# Patient Record
Sex: Female | Born: 1975 | Race: Black or African American | Hispanic: No | Marital: Single
Health system: Southern US, Community
[De-identification: ages and names within clinical notes are randomized; demographics above are authoritative.]

## PROBLEM LIST (undated history)

## (undated) DIAGNOSIS — E559 Vitamin D deficiency, unspecified: Secondary | ICD-10-CM

## (undated) DIAGNOSIS — R635 Abnormal weight gain: Secondary | ICD-10-CM

## (undated) DIAGNOSIS — F3130 Bipolar disorder, current episode depressed, mild or moderate severity, unspecified: Secondary | ICD-10-CM

## (undated) DIAGNOSIS — F5081 Binge eating disorder: Secondary | ICD-10-CM

## (undated) DIAGNOSIS — N809 Endometriosis, unspecified: Secondary | ICD-10-CM

## (undated) DIAGNOSIS — T7840XA Allergy, unspecified, initial encounter: Secondary | ICD-10-CM

## (undated) DIAGNOSIS — E538 Deficiency of other specified B group vitamins: Secondary | ICD-10-CM

## (undated) DIAGNOSIS — M7552 Bursitis of left shoulder: Secondary | ICD-10-CM

## (undated) DIAGNOSIS — K5900 Constipation, unspecified: Secondary | ICD-10-CM

## (undated) DIAGNOSIS — Z975 Presence of (intrauterine) contraceptive device: Secondary | ICD-10-CM

## (undated) DIAGNOSIS — D509 Iron deficiency anemia, unspecified: Secondary | ICD-10-CM

## (undated) DIAGNOSIS — G4700 Insomnia, unspecified: Secondary | ICD-10-CM

## (undated) DIAGNOSIS — E669 Obesity, unspecified: Secondary | ICD-10-CM

## (undated) DIAGNOSIS — F50819 Binge eating disorder, unspecified: Secondary | ICD-10-CM

## (undated) DIAGNOSIS — M1990 Unspecified osteoarthritis, unspecified site: Secondary | ICD-10-CM

## (undated) HISTORY — DX: Binge eating disorder: F50.81

## (undated) HISTORY — DX: Endometriosis, unspecified: N80.9

## (undated) HISTORY — DX: Obesity, unspecified: E66.9

## (undated) HISTORY — PX: CHOLECYSTECTOMY: SHX55

## (undated) HISTORY — DX: Binge eating disorder, unspecified: F50.819

## (undated) HISTORY — DX: Constipation, unspecified: K59.00

## (undated) HISTORY — DX: Bipolar disorder, current episode depressed, mild or moderate severity, unspecified: F31.30

## (undated) HISTORY — DX: Unspecified osteoarthritis, unspecified site: M19.90

## (undated) HISTORY — DX: Abnormal weight gain: R63.5

## (undated) HISTORY — DX: Insomnia, unspecified: G47.00

## (undated) HISTORY — DX: Deficiency of other specified B group vitamins: E53.8

## (undated) HISTORY — DX: Bursitis of left shoulder: M75.52

## (undated) HISTORY — DX: Presence of (intrauterine) contraceptive device: Z97.5

## (undated) HISTORY — DX: Vitamin D deficiency, unspecified: E55.9

## (undated) HISTORY — DX: Iron deficiency anemia, unspecified: D50.9

## (undated) HISTORY — DX: Allergy, unspecified, initial encounter: T78.40XA

## (undated) HISTORY — PX: TUBAL LIGATION: SHX77

---

## 2003-03-13 ENCOUNTER — Emergency Department (HOSPITAL_COMMUNITY): Admission: EM | Admit: 2003-03-13 | Discharge: 2003-03-14 | Payer: Self-pay | Admitting: Emergency Medicine

## 2003-06-29 ENCOUNTER — Other Ambulatory Visit: Admission: RE | Admit: 2003-06-29 | Discharge: 2003-06-29 | Payer: Self-pay | Admitting: Family Medicine

## 2003-11-11 ENCOUNTER — Emergency Department (HOSPITAL_COMMUNITY): Admission: EM | Admit: 2003-11-11 | Discharge: 2003-11-11 | Payer: Self-pay | Admitting: Family Medicine

## 2003-11-12 ENCOUNTER — Ambulatory Visit (HOSPITAL_COMMUNITY): Admission: RE | Admit: 2003-11-12 | Discharge: 2003-11-12 | Payer: Self-pay | Admitting: Family Medicine

## 2018-02-16 ENCOUNTER — Emergency Department (HOSPITAL_COMMUNITY): Payer: Medicare Other

## 2018-02-16 ENCOUNTER — Emergency Department (HOSPITAL_COMMUNITY)
Admission: EM | Admit: 2018-02-16 | Discharge: 2018-02-16 | Disposition: A | Discharge status: Home / Self Care | Payer: Medicare Other | Attending: Emergency Medicine | Admitting: Emergency Medicine

## 2018-02-16 ENCOUNTER — Other Ambulatory Visit: Payer: Self-pay

## 2018-02-16 ENCOUNTER — Encounter (HOSPITAL_COMMUNITY): Payer: Self-pay | Admitting: Emergency Medicine

## 2018-02-16 DIAGNOSIS — R202 Paresthesia of skin: Secondary | ICD-10-CM | POA: Insufficient documentation

## 2018-02-16 DIAGNOSIS — M25512 Pain in left shoulder: Secondary | ICD-10-CM | POA: Diagnosis not present

## 2018-02-16 DIAGNOSIS — R200 Anesthesia of skin: Secondary | ICD-10-CM | POA: Insufficient documentation

## 2018-02-16 MED ORDER — CYCLOBENZAPRINE HCL 10 MG PO TABS: 10.0000 mg | ORAL_TABLET | Freq: Two times a day (BID) | ORAL | 0 refills | Status: DC | PRN

## 2018-02-16 MED ORDER — KETOROLAC TROMETHAMINE 30 MG/ML IJ SOLN
30.0000 mg | Freq: Once | INTRAMUSCULAR | Status: AC
  Administered 2018-02-16: 30 mg via INTRAMUSCULAR
  Filled 2018-02-16: qty 1

## 2018-02-16 MED ORDER — PREDNISONE 10 MG (21) PO TBPK: ORAL_TABLET | Freq: Every day | ORAL | 0 refills | Status: DC

## 2018-02-16 NOTE — ED Provider Notes (Signed)
[facility]-EMERGENCY DEPT Provider Note   CSN: [id number] Arrival date & time: 02/16/18  1257     History   Chief Complaint Chief Complaint  Patient presents with  . Arm Pain    HPI Natalie Buchanan is a 42 y.o. female presents today for evaluation of acute onset, progressively worsening left shoulder pain for 3 days.  She states that pain is now constant, dull and throbbing at rest but sharp with any movement of the left shoulder.  Pain will radiate into the upper arm and she will experience numbness and tingling of the left upper extremity occasionally.  She denies chest pain or shortness of breath, no fevers or chills.  No history of IV drug use or septic joint.  She is not diabetic.  She has tried ibuprofen and Goody's arthritis with mild relief.  Denies any known injury.  Has had similar pain in the past with both shoulders intermittently but has not had it evaluated medically yet.  The history is provided by the patient.    History reviewed. No pertinent past medical history.  There are no active problems to display for this patient.   History reviewed. No pertinent surgical history.   OB History   None      Home Medications    Prior to Admission medications   Medication Sig Start Date End Date Taking? Authorizing Provider  cyclobenzaprine (FLEXERIL) 10 MG tablet Take 1 tablet (10 mg total) by mouth 2 (two) times daily as needed for muscle spasms. 02/16/18   Jasper Ruminski A, PA-C  predniSONE (STERAPRED UNI-PAK 21 TAB) 10 MG (21) TBPK tablet Take by mouth daily. Take 6 tabs by mouth daily  for 2 days, then 5 tabs for 2 days, then 4 tabs for 2 days, then 3 tabs for 2 days, 2 tabs for 2 days, then 1 tab by mouth daily for 2 days 02/16/18   Jeanie Sewer, PA-C    Family History No family history on file.  Social History Social History   Tobacco Use  . Smoking status: Not on file  Substance Use Topics  . Alcohol use: Not on file  . Drug use: Not on  file     Allergies   Patient has no known allergies.   Review of Systems Review of Systems  Constitutional: Negative for chills and fever.  Respiratory: Negative for shortness of breath.   Cardiovascular: Negative for chest pain.  Musculoskeletal: Positive for arthralgias.  Neurological: Positive for numbness.  All other systems reviewed and are negative.    Physical Exam Updated Vital Signs BP 124/88 (BP Location: Right Arm)   Pulse 79   Temp 98.4 F (36.9 C) (Oral)   Resp 18   LMP 02/09/2018 (Approximate)   SpO2 100%   Physical Exam  Constitutional: She appears well-developed and well-nourished. No distress.  Resting in chair, appears uncomfortable, holding her left upper extremity close to her side  HENT:  Head: Normocephalic and atraumatic.  Eyes: Conjunctivae are normal. Right eye exhibits no discharge. Left eye exhibits no discharge.  Neck: No JVD present. No tracheal deviation present.  Cardiovascular: Normal rate and intact distal pulses.  2+ radial pulses bilaterally  Pulmonary/Chest: Effort normal.  Abdominal: She exhibits no distension.  Musculoskeletal: She exhibits tenderness. She exhibits no edema.       Right shoulder: Normal.       Left shoulder: She exhibits decreased range of motion, tenderness and pain. She exhibits no swelling, no effusion, no  crepitus, no deformity, no laceration, normal pulse and normal strength.       Left elbow: Normal.  Tenderness to palpation of the left acromioclavicular joint.  Decreased active and passive range of motion secondary to pain.  5/5 strength of BUE major muscle groups with pain elicited with flexion of the left elbow and any range of motion of the left shoulder.  Examination of the left elbow within normal limits.  Negative empty can sign, positive Hawkins and Neer's impingement tests  Neurological: She is alert.  Fluent speech, no facial droop, sensation intact to soft touch of bilateral upper extremities.  Good  grip strength bilaterally.  Skin: Skin is warm and dry. No erythema.  Psychiatric: She has a normal mood and affect. Her behavior is normal.  Nursing note and vitals reviewed.    ED Treatments / Results  Labs (all labs ordered are listed, but only abnormal results are displayed) Labs Reviewed - No data to display  EKG None  Radiology Dg Shoulder Left  Result Date: 02/16/2018 CLINICAL DATA:  Intermittent LEFT arm pain from shoulder to elbow for 3 days, constant sharp pain since this morning EXAM: LEFT SHOULDER - 2+ VIEW COMPARISON:  None FINDINGS: Osseous mineralization normal. AC joint alignment normal. Visualized LEFT ribs intact. No acute fracture, dislocation, or bone destruction. IMPRESSION: No acute abnormalities. Electronically Signed   By: Ulyses Southward M.D.   On: 02/16/2018 14:11    Procedures Procedures (including critical care time)  Medications Ordered in ED Medications  ketorolac (TORADOL) 30 MG/ML injection 30 mg (30 mg Intramuscular Given 02/16/18 1339)     Initial Impression / Assessment and Plan / ED Course  I have reviewed the triage vital signs and the nursing notes.  Pertinent labs & imaging results that were available during my care of the patient were reviewed by me and considered in my medical decision making (see chart for details).     Patient with right shoulder/upper extremity pain for 3 days.  She is afebrile, vital signs are stable.  She is nontoxic in appearance.  She is uncomfortable with active and passive range of motion of the left upper extremity and examination is somewhat limited secondary to pain.  No warmth or erythema to the joint, no risk factors for septic joint. No chest pain and symptoms appear to be musculoskeletal in etiology, doubt ACS/MI or other acute cardiopulmonary abnormality.  Department are soft, doubt DVT.  History and physical examination suggestive of possible rotator cuff pathology.  Radiographs show no acute osseous  abnormality. RICE therapy indicated and discussed with patient.  Will discharge with shoulder sling for comfort but advised of gentle stretching throughout the day to avoid muscle stiffness.  We will also discharge with Flexeril and steroid taper.  Advised of appropriate use of these medications.  Recommend follow-up with PCP or orthopedist for reevaluation.  Discussed strict ED return precautions. Pt verbalized understanding of and agreement with plan and is safe for discharge home at this time.   Final Clinical Impressions(s) / ED Diagnoses   Final diagnoses:  Acute pain of left shoulder    ED Discharge Orders         Ordered    cyclobenzaprine (FLEXERIL) 10 MG tablet  2 times daily PRN     02/16/18 1427    predniSONE (STERAPRED UNI-PAK 21 TAB) 10 MG (21) TBPK tablet  Daily     02/16/18 1427           Rylynn Schoneman, [place] A, PA-C  02/16/18 1429    Little, Ambrose Finland, MD 02/17/18 [phone]

## 2018-02-16 NOTE — ED Triage Notes (Signed)
Patient c/o intermittent left arm pain from shoulder to elbow x3 days. Reports constant sharp pain since this morning. Denies injury.

## 2018-02-16 NOTE — Discharge Instructions (Signed)
1. Medications: Take steroid taper as prescribed with food to avoid upset stomach issues.  Do not take ibuprofen, Advil, Aleve, or Motrin while taking this medicine.  You may take [phone] mg of Tylenol every 6 hours as needed for pain. Do not exceed 4000 mg of Tylenol daily.  You can take Flexeril as needed for muscle relaxation but this medication may make you drowsy so do not drive, drink alcohol, operate heavy machinery, or make important decisions while you are using this medicine.  I typically recommend only taking this medicine at night.  You can also cut these tablets in half if they are very strong. 2. Treatment: rest, ice, elevate and use brace, drink plenty of fluids, gentle stretching (see attached exercises, but you can also YouTube search physical therapy exercises).  Make sure you take your arm out of the shoulder sling 2-3 times daily and do some gentle stretching to avoid muscle stiffness. 3. Follow Up: Please followup with orthopedics as directed or your PCP in 1 week if no improvement for discussion of your diagnoses and further evaluation after today's visit; if you do not have a primary care doctor use the resource guide provided to find one; Please return to the ER for worsening symptoms or other concerns such as worsening swelling, redness of the skin, fevers, loss of pulses, or loss of feeling

## 2018-02-17 ENCOUNTER — Encounter (HOSPITAL_COMMUNITY): Payer: Self-pay | Admitting: Emergency Medicine

## 2018-02-17 ENCOUNTER — Emergency Department (HOSPITAL_COMMUNITY)
Admission: EM | Admit: 2018-02-17 | Discharge: 2018-02-17 | Disposition: A | Discharge status: Home / Self Care | Payer: Medicare Other | Attending: Emergency Medicine | Admitting: Emergency Medicine

## 2018-02-17 ENCOUNTER — Other Ambulatory Visit: Payer: Self-pay

## 2018-02-17 DIAGNOSIS — M25512 Pain in left shoulder: Secondary | ICD-10-CM

## 2018-02-17 MED ORDER — OXYCODONE-ACETAMINOPHEN 5-325 MG PO TABS: 1.0000 | ORAL_TABLET | Freq: Four times a day (QID) | ORAL | 0 refills | Status: DC | PRN

## 2018-02-17 MED ORDER — KETOROLAC TROMETHAMINE 60 MG/2ML IM SOLN
60.0000 mg | Freq: Once | INTRAMUSCULAR | Status: AC
  Administered 2018-02-17: 60 mg via INTRAMUSCULAR
  Filled 2018-02-17: qty 2

## 2018-02-17 NOTE — ED Provider Notes (Signed)
[facility] EMERGENCY DEPARTMENT Provider Note   CSN: [id number] Arrival date & time: 02/17/18  0155     History   Chief Complaint Chief Complaint  Patient presents with  . Arm Pain    HPI Akya Fiorello is a 42 y.o. female.  Patient is a 43 year old female presenting with complaints of severe left shoulder pain.  This is been ongoing for several days and began in the absence of any injury or trauma.  She was seen at [facility] yesterday evening with the same complaint.  She was given a steroid taper along with pain medication, however this is not helping.  She denies any new injury.  She denies any numbness or tingling.  The history is provided by the patient.  Arm Pain  This is a new problem. Episode onset: 3 days ago. The problem occurs constantly. The problem has been rapidly worsening. Exacerbated by: Movement and palpation. Nothing relieves the symptoms. She has tried nothing for the symptoms.    History reviewed. No pertinent past medical history.  There are no active problems to display for this patient.   History reviewed. No pertinent surgical history.   OB History   None      Home Medications    Prior to Admission medications   Medication Sig Start Date End Date Taking? Authorizing Provider  cyclobenzaprine (FLEXERIL) 10 MG tablet Take 1 tablet (10 mg total) by mouth 2 (two) times daily as needed for muscle spasms. 02/16/18   Fawze, Mina A, PA-C  predniSONE (STERAPRED UNI-PAK 21 TAB) 10 MG (21) TBPK tablet Take by mouth daily. Take 6 tabs by mouth daily  for 2 days, then 5 tabs for 2 days, then 4 tabs for 2 days, then 3 tabs for 2 days, 2 tabs for 2 days, then 1 tab by mouth daily for 2 days 02/16/18   Jeanie Sewer, PA-C    Family History No family history on file.  Social History Social History   Tobacco Use  . Smoking status: Never Smoker  . Smokeless tobacco: Never Used  Substance Use Topics  . Alcohol use: Not Currently  . Drug  use: Not Currently     Allergies   Patient has no known allergies.   Review of Systems Review of Systems  All other systems reviewed and are negative.    Physical Exam Updated Vital Signs BP 125/75 (BP Location: Right Arm)   Pulse 82   Temp 98.4 F (36.9 C) (Oral)   Resp 18   LMP 02/09/2018 (Approximate)   SpO2 100%   Physical Exam  Constitutional: She is oriented to person, place, and time. She appears well-developed and well-nourished. No distress.  HENT:  Head: Normocephalic and atraumatic.  Neck: Normal range of motion. Neck supple.  Cardiovascular: Normal rate and regular rhythm. Exam reveals no gallop and no friction rub.  No murmur heard. Pulmonary/Chest: Effort normal and breath sounds normal. No respiratory distress. She has no wheezes.  Abdominal: Soft. Bowel sounds are normal. She exhibits no distension. There is no tenderness.  Musculoskeletal: Normal range of motion.  There is exquisite tenderness over the lateral and anterior aspect of the shoulder/deltoid.  She has pain with range of motion.  There is no warmth or erythema.  Ulnar and radial pulses are easily palpable and she is able to flex and extend all fingers.  Neurological: She is alert and oriented to person, place, and time.  Skin: Skin is warm and dry. She is not  diaphoretic.  Nursing note and vitals reviewed.    ED Treatments / Results  Labs (all labs ordered are listed, but only abnormal results are displayed) Labs Reviewed - No data to display  EKG None  Radiology Dg Shoulder Left  Result Date: 02/16/2018 CLINICAL DATA:  Intermittent LEFT arm pain from shoulder to elbow for 3 days, constant sharp pain since this morning EXAM: LEFT SHOULDER - 2+ VIEW COMPARISON:  None FINDINGS: Osseous mineralization normal. AC joint alignment normal. Visualized LEFT ribs intact. No acute fracture, dislocation, or bone destruction. IMPRESSION: No acute abnormalities. Electronically Signed   By: Ulyses Southward  M.D.   On: 02/16/2018 14:11    Procedures Procedures (including critical care time)  Medications Ordered in ED Medications  ketorolac (TORADOL) injection 60 mg (has no administration in time range)     Initial Impression / Assessment and Plan / ED Course  I have reviewed the triage vital signs and the nursing notes.  Pertinent labs & imaging results that were available during my care of the patient were reviewed by me and considered in my medical decision making (see chart for details).  Patient with left shoulder pain that is atraumatic.  Her arm is neurovascularly intact.  I doubt a septic joint.  She will be treated with Toradol and discharged with Percocet.  She was prescribed a steroid taper yesterday at [facility] which she has not began taking.  I have advised her to start taking this as soon as possible.  Final Clinical Impressions(s) / ED Diagnoses   Final diagnoses:  None    ED Discharge Orders    None       Geoffery Lyons, MD 02/17/18 [phone]

## 2018-02-17 NOTE — ED Triage Notes (Signed)
Pt reports L arm pain X 4 days. Pt states pain is so severe she is unable to move her arm. Pt seen at [facility] yesterday for same, states pain medicine isnt helping.

## 2018-02-17 NOTE — Discharge Instructions (Addendum)
Begin taking the steroid taper that was prescribed yesterday evening.  Percocet as prescribed as needed for pain.  No driving or operating heavy machinery while taking this medication.  Follow-up with your primary doctor if symptoms are not improving in the next 3 to 4 days.

## 2018-08-07 ENCOUNTER — Telehealth: Payer: Self-pay | Admitting: Internal Medicine

## 2018-08-07 NOTE — Telephone Encounter (Signed)
A new hem appt has been scheduled for the pt to see Dr. Melton Alar on 4/1 at 950am. Pt aware to arrive 15 minutes early.

## 2018-08-09 ENCOUNTER — Encounter (HOSPITAL_COMMUNITY): Payer: Self-pay | Admitting: Emergency Medicine

## 2018-08-09 ENCOUNTER — Emergency Department (HOSPITAL_COMMUNITY)
Admission: EM | Admit: 2018-08-09 | Discharge: 2018-08-09 | Disposition: A | Discharge status: Home / Self Care | Payer: Medicare Other | Attending: Emergency Medicine | Admitting: Emergency Medicine

## 2018-08-09 ENCOUNTER — Other Ambulatory Visit: Payer: Self-pay

## 2018-08-09 DIAGNOSIS — M79601 Pain in right arm: Secondary | ICD-10-CM | POA: Insufficient documentation

## 2018-08-09 DIAGNOSIS — M79603 Pain in arm, unspecified: Secondary | ICD-10-CM

## 2018-08-09 DIAGNOSIS — Z5321 Procedure and treatment not carried out due to patient leaving prior to being seen by health care provider: Secondary | ICD-10-CM | POA: Diagnosis not present

## 2018-08-09 NOTE — ED Provider Notes (Signed)
  3:07 AM Notified that patient was seen leaving the department before I could get to her room.  Apparently her 42 year old daughter was left in the lobby unattended and had to leave as no one else available to care for her child.  I did not see or evaluate patient.     Garlon Hatchet, PA-C 08/09/18 2080    Derwood Kaplan, MD 08/13/18 [phone]

## 2018-08-09 NOTE — ED Notes (Signed)
Pt. Left without being seen by Provider. Refused to sign signature pad.

## 2018-08-09 NOTE — ED Triage Notes (Signed)
Patient is complaining of right arm pain started last week. Patient states she is not able to move her arm. Patient states in November she had the same thing in the left arm.

## 2018-08-09 NOTE — ED Triage Notes (Deleted)
Patient complaining of abscess on right lower inner arm. Patient is an IV drug user. Patient states it started 4 days ago. Arm is redden. Patient has skin marker on skin and is not going outside of the boundaries. 

## 2018-08-09 NOTE — ED Notes (Signed)
Pt. Seen leaving the department @03 :05am.

## 2018-08-13 ENCOUNTER — Inpatient Hospital Stay: Payer: Medicare Other | Attending: Internal Medicine | Admitting: Internal Medicine

## 2018-08-13 ENCOUNTER — Encounter: Payer: Self-pay | Admitting: Internal Medicine

## 2018-08-13 ENCOUNTER — Other Ambulatory Visit: Payer: Self-pay

## 2018-08-13 VITALS — BP 124/69 | HR 83 | Temp 99.0°F | Resp 18 | Ht 62.0 in | Wt 273.5 lb

## 2018-08-13 DIAGNOSIS — D508 Other iron deficiency anemias: Secondary | ICD-10-CM

## 2018-08-13 DIAGNOSIS — Z92241 Personal history of systemic steroid therapy: Secondary | ICD-10-CM

## 2018-08-13 DIAGNOSIS — Z8049 Family history of malignant neoplasm of other genital organs: Secondary | ICD-10-CM

## 2018-08-13 DIAGNOSIS — D72829 Elevated white blood cell count, unspecified: Secondary | ICD-10-CM

## 2018-08-13 DIAGNOSIS — M2550 Pain in unspecified joint: Secondary | ICD-10-CM

## 2018-08-13 DIAGNOSIS — F329 Major depressive disorder, single episode, unspecified: Secondary | ICD-10-CM

## 2018-08-13 DIAGNOSIS — D72828 Other elevated white blood cell count: Secondary | ICD-10-CM

## 2018-08-13 DIAGNOSIS — Z114 Encounter for screening for human immunodeficiency virus [HIV]: Secondary | ICD-10-CM

## 2018-08-13 DIAGNOSIS — R718 Other abnormality of red blood cells: Secondary | ICD-10-CM | POA: Diagnosis not present

## 2018-08-13 DIAGNOSIS — M25559 Pain in unspecified hip: Secondary | ICD-10-CM

## 2018-08-13 DIAGNOSIS — F319 Bipolar disorder, unspecified: Secondary | ICD-10-CM

## 2018-08-13 DIAGNOSIS — N809 Endometriosis, unspecified: Secondary | ICD-10-CM | POA: Insufficient documentation

## 2018-08-13 DIAGNOSIS — Z975 Presence of (intrauterine) contraceptive device: Secondary | ICD-10-CM | POA: Insufficient documentation

## 2018-08-13 DIAGNOSIS — D539 Nutritional anemia, unspecified: Secondary | ICD-10-CM

## 2018-08-13 DIAGNOSIS — F419 Anxiety disorder, unspecified: Secondary | ICD-10-CM

## 2018-08-13 DIAGNOSIS — Z803 Family history of malignant neoplasm of breast: Secondary | ICD-10-CM

## 2018-08-13 NOTE — Progress Notes (Signed)
Referring Physician:  Dr. Pete Pelt, [facility].    Diagnosis Other iron deficiency anemia - Plan: CBC with Differential (Cancer Center Only), CMP (Cancer Center only), Lactate dehydrogenase (LDH), Sedimentation rate, Ferritin, Iron and TIBC, Jak 2 V617F ([facility]), Jak 2 Exon 12 ([facility]), BCR ABL1 FISH ([facility]), Hemoglobinopathy evaluation, Vitamin B12, Folate, Serum, Methylmalonic acid, serum, Hepatitis B surface antibody, Hepatitis B surface antigen, Hepatitis B core antibody, total, Hepatitis C antibody, HIV antibody (with reflex), Rheumatoid factor, ANA, IFA (with reflex), SPEP with reflex to IFE  Pain in joint involving pelvic region and thigh, unspecified laterality - Plan: CBC with Differential (Cancer Center Only), CMP (Cancer Center only), Lactate dehydrogenase (LDH), Sedimentation rate, Ferritin, Iron and TIBC, Jak 2 V617F ([facility]), Jak 2 Exon 12 ([facility]), BCR ABL1 FISH ([facility]), Hemoglobinopathy evaluation, Vitamin B12, Folate, Serum, Methylmalonic acid, serum, Hepatitis B surface antibody, Hepatitis B surface antigen, Hepatitis B core antibody, total, Hepatitis C antibody, HIV antibody (with reflex), Rheumatoid factor, ANA, IFA (with reflex), SPEP with reflex to IFE  Other elevated white blood cell (WBC) count - Plan: CBC with Differential (Cancer Center Only), CMP (Cancer Center only), Lactate dehydrogenase (LDH), Sedimentation rate, Ferritin, Iron and TIBC, Jak 2 V617F ([facility]), Jak 2 Exon 12 ([facility]), BCR ABL1 FISH ([facility]), Hemoglobinopathy evaluation, Vitamin B12, Folate, Serum, Methylmalonic acid, serum, Hepatitis B surface antibody, Hepatitis B surface antigen, Hepatitis B core antibody, total, Hepatitis C antibody, HIV antibody (with reflex), Rheumatoid factor, ANA, IFA (with reflex), SPEP with reflex to IFE  Screening for HIV (human immunodeficiency virus) - Plan: CBC with Differential (Cancer Center Only), CMP (Cancer Center only), Lactate  dehydrogenase (LDH), Sedimentation rate, Ferritin, Iron and TIBC, Jak 2 V617F ([facility]), Jak 2 Exon 12 ([facility]), BCR ABL1 FISH ([facility]), Hemoglobinopathy evaluation, Vitamin B12, Folate, Serum, Methylmalonic acid, serum, Hepatitis B surface antibody, Hepatitis B surface antigen, Hepatitis B core antibody, total, Hepatitis C antibody, HIV antibody (with reflex), Rheumatoid factor, ANA, IFA (with reflex), SPEP with reflex to IFE  Nutritional anemia - Plan: CBC with Differential (Cancer Center Only), CMP (Cancer Center only), Lactate dehydrogenase (LDH), Sedimentation rate, Ferritin, Iron and TIBC, Jak 2 V617F ([facility]), Jak 2 Exon 12 ([facility]), BCR ABL1 FISH ([facility]), Hemoglobinopathy evaluation, Vitamin B12, Folate, Serum, Methylmalonic acid, serum, Hepatitis B surface antibody, Hepatitis B surface antigen, Hepatitis B core antibody, total, Hepatitis C antibody, HIV antibody (with reflex), Rheumatoid factor, ANA, IFA (with reflex), SPEP with reflex to IFE  Staging Cancer Staging No matching staging information was found for the patient.  Assessment and Plan:  1.  Leucocytosis.  43 year old female referred for evaluation due to leukocytosis.  Pt reports joint discomfort and is occasionally treated with pulse steroids. She reports she last took Prednisone in February.  She denies fevers, chills, night sweats and has noted no adenopathy.  She denies smoking.  Labs done 09/14/2016 showed WBC 8 HB 12.7 plts 307,000.  Normal differential.  1Labs done 07/24/2017 showed WBC 10 HB 12.4.  Labs done 05/15/2018 showed WBC 15 HB 11.2 pts 365,000.  Labs done 07/02/2018 showed WBC 11.6 HB 11.6 plts 398,000.  Pt denies new medications or smoking.  She denies family history of leukemias or lymphomas.  Pt is seen today for consultation due to leucocytosis.    Discussed with pt today potential etiologies of mild elevation in WBC count.  She does pulse steroids which are likely to cause leucocytosis.  Pt will have labs done and  will check Hepatitis panel and HIV, BCR/ABL and Jak 2  as well as sed rate, RF, ANA.  Pt will be set up for phone visit to go over results.  All questions answered and pt expressed understanding of information presented.    2.  Joint pain.  Pt reports she takes pulse steroids.  Awaiting results of ANA, RF, sed rate and SPEP.   She will be set up for phone visit to go over results.   3.  Microcytosis.  Labs done 07/02/2018 showed HB 11.6 with MCV of 81.  Awaiting results of iron studies and HB electrophoresis.  Pt will have phone visit follow-up to go over results.   4.  Bipolar disorder, anxiety and depression.  Follow-up with PCP or psychiatry as directed.   5.  Health maintenance. Mammogram screenings as recommended.    40 minutes spent with more than 50% spent in review of records, counseling and coordination of care.    HPI:  43 year old female referred for evaluation due to leukocytosis.  Pt reports joint discomfort and is occasionally treated with pulse steroids. She reports she last took Prednisone in February.  She denies fevers, chills, night sweats and has noted no adenopathy.  She denies smoking.  Labs done 09/14/2016 showed WBC 8 HB 12.7 plts 307,000.  Normal differential.  1Labs done 07/24/2017 showed WBC 10 HB 12.4.  Labs done 05/15/2018 showed WBC 15 HB 11.2 pts 365,000.  Labs done 07/02/2018 showed WBC 11.6 HB 11.6 plts 398,000.  Pt denies new medications or smoking.  She denies family history of leukemias or lymphomas.  Pt is seen today for consultation due to leucocytosis.    Problem List Anxiety, depression, bipolar disorder,   Past Medical History Anxiety, depression, bipolar disorder, arthritis  Past Surgical History Cholecystectomy and tubal ligation  Family History Breast cancer in mother and sister.  Cervical cancer in sister.  No history of leukemia or lymphoma in family.    Social History  reports that she is a non-smoker but has been exposed to tobacco smoke. She has  never used smokeless tobacco. She reports that she does not drink alcohol or use drugs.  Medications  Current Outpatient Medications:  .  diclofenac (VOLTAREN) 75 MG EC tablet, Take 75 mg by mouth 2 (two) times daily., Disp: , Rfl:  .  meloxicam (MOBIC) 15 MG tablet, Take 15 mg by mouth daily., Disp: , Rfl:  .  polyethylene glycol (MIRALAX / GLYCOLAX) packet, Take 17 g by mouth daily., Disp: , Rfl:  .  Brexpiprazole (REXULTI) 0.5 MG TABS, TK 1 T PO HS UTD  psych prescribes, Disp: , Rfl:  .  buPROPion (WELLBUTRIN XL) 150 MG 24 hr tablet, Take by mouth., Disp: , Rfl:  .  buPROPion (WELLBUTRIN XL) 300 MG 24 hr tablet, Take by mouth., Disp: , Rfl:  .  cyclobenzaprine (FLEXERIL) 10 MG tablet, Take 1 tablet (10 mg total) by mouth 2 (two) times daily as needed for muscle spasms., Disp: 10 tablet, Rfl: 0 .  doxepin (SINEQUAN) 10 MG capsule, TK 1 C PO HS FOR SLP OR INSOMNIA, Disp: , Rfl:  .  gabapentin (NEURONTIN) 400 MG capsule, TAKE 1 CAPSULE BY MOUTH NIGHTLY AS NEEDED FOR PAINFUL MENSTRUATION, Disp: , Rfl:  .  oxyCODONE-acetaminophen (PERCOCET) 5-325 MG tablet, Take 1-2 tablets by mouth every 6 (six) hours as needed. (Patient not taking: Reported on 08/13/2018), Disp: 15 tablet, Rfl: 0 .  predniSONE (STERAPRED UNI-PAK 21 TAB) 10 MG (21) TBPK tablet, Take by mouth daily. Take 6 tabs by mouth daily  for 2 days, then 5 tabs for 2 days, then 4 tabs for 2 days, then 3 tabs for 2 days, 2 tabs for 2 days, then 1 tab by mouth daily for 2 days, Disp: 42 tablet, Rfl: 0  Allergies Duloxetine hcl and Linaclotide  Review of Systems Review of Systems - Oncology ROS negative other than joint pain.     Physical Exam  Vitals Wt Readings from Last 3 Encounters:  08/13/18 273 lb 8 oz (124.1 kg)  08/09/18 160 lb (72.6 kg)   Temp Readings from Last 3 Encounters:  08/13/18 99 F (37.2 C) (Oral)  08/09/18 98.8 F (37.1 C) (Oral)  02/17/18 98.4 F (36.9 C) (Oral)   BP Readings from Last 3 Encounters:   08/13/18 124/69  08/09/18 127/74  02/17/18 125/75   Pulse Readings from Last 3 Encounters:  08/13/18 83  08/09/18 79  02/17/18 82   Constitutional: Well-developed, well-nourished, and in no distress.   HENT: Head: Normocephalic and atraumatic.  Mouth/Throat: No oropharyngeal exudate. Mucosa moist. Eyes: Pupils are equal, round, and reactive to light. Conjunctivae are normal. No scleral icterus.  Neck: Normal range of motion. Neck supple. No JVD present.  Cardiovascular: Normal rate, regular rhythm and normal heart sounds.  Exam reveals no gallop and no friction rub.   No murmur heard. Pulmonary/Chest: Effort normal and breath sounds normal. No respiratory distress. No wheezes.No rales.  Abdominal: Soft. Bowel sounds are normal. No distension. There is no tenderness. There is no guarding.  Musculoskeletal: No edema or tenderness.  Lymphadenopathy: No cervical,axillary or supraclavicular adenopathy.  Neurological: Alert and oriented to person, place, and time. No cranial nerve deficit.  Skin: Skin is warm and dry. No rash noted. No erythema. No pallor.  Psychiatric: Affect and judgment normal.   Labs No visits with results within 3 Day(s) from this visit.  Latest known visit with results is:  No results found for any previous visit.     Pathology Orders Placed This Encounter  Procedures  . CBC with Differential (Cancer Center Only)    Standing Status:   Future    Standing Expiration Date:   08/13/2019  . CMP (Cancer Center only)    Standing Status:   Future    Standing Expiration Date:   08/13/2019  . Lactate dehydrogenase (LDH)    Standing Status:   Future    Standing Expiration Date:   08/13/2019  . Sedimentation rate    Standing Status:   Future    Standing Expiration Date:   08/13/2019  . Ferritin    Standing Status:   Future    Standing Expiration Date:   08/13/2019  . Iron and TIBC    Standing Status:   Future    Standing Expiration Date:   08/13/2019  . Jak 2 V617F  ([facility])    Standing Status:   Future    Standing Expiration Date:   08/13/2019  . Jak 2 Exon 12 ([facility])    Standing Status:   Future    Standing Expiration Date:   08/13/2019  . BCR ABL1 FISH ([facility])    Standing Status:   Future    Standing Expiration Date:   08/13/2019  . Hemoglobinopathy evaluation    Standing Status:   Future    Standing Expiration Date:   08/13/2019  . Vitamin B12    Standing Status:   Future    Standing Expiration Date:   08/13/2019  . Folate, Serum    Standing Status:   Future  Standing Expiration Date:   08/13/2019  . Methylmalonic acid, serum    Standing Status:   Future    Standing Expiration Date:   08/13/2019  . Hepatitis B surface antibody    Standing Status:   Future    Standing Expiration Date:   08/13/2019  . Hepatitis B surface antigen    Standing Status:   Future    Standing Expiration Date:   08/13/2019  . Hepatitis B core antibody, total    Standing Status:   Future    Standing Expiration Date:   08/13/2019  . Hepatitis C antibody    Standing Status:   Future    Standing Expiration Date:   08/13/2019  . HIV antibody (with reflex)    Standing Status:   Future    Standing Expiration Date:   08/13/2019  . Rheumatoid factor    Standing Status:   Future    Standing Expiration Date:   08/13/2019  . ANA, IFA (with reflex)    Standing Status:   Future    Standing Expiration Date:   08/13/2019  . SPEP with reflex to IFE    Standing Status:   Future    Standing Expiration Date:   08/13/2019       Ahmed Prima MD

## 2018-08-14 ENCOUNTER — Other Ambulatory Visit: Payer: Medicare Other

## 2018-08-26 ENCOUNTER — Inpatient Hospital Stay: Payer: Medicare Other

## 2018-08-26 ENCOUNTER — Other Ambulatory Visit: Payer: Self-pay

## 2018-08-26 DIAGNOSIS — Z114 Encounter for screening for human immunodeficiency virus [HIV]: Secondary | ICD-10-CM

## 2018-08-26 DIAGNOSIS — D539 Nutritional anemia, unspecified: Secondary | ICD-10-CM

## 2018-08-26 DIAGNOSIS — D508 Other iron deficiency anemias: Secondary | ICD-10-CM

## 2018-08-26 DIAGNOSIS — M25559 Pain in unspecified hip: Secondary | ICD-10-CM

## 2018-08-26 DIAGNOSIS — D72828 Other elevated white blood cell count: Secondary | ICD-10-CM

## 2018-08-26 LAB — CBC WITH DIFFERENTIAL (CANCER CENTER ONLY)
Abs Immature Granulocytes: 0.03 10*3/uL (ref 0.00–0.07)
Basophils Absolute: 0 10*3/uL (ref 0.0–0.1)
Basophils Relative: 0 %
Eosinophils Absolute: 0.3 10*3/uL (ref 0.0–0.5)
Eosinophils Relative: 3 %
HCT: 38.1 % (ref 36.0–46.0)
Hemoglobin: 11.6 g/dL — ABNORMAL LOW (ref 12.0–15.0)
Immature Granulocytes: 0 %
Lymphocytes Relative: 24 %
Lymphs Abs: 2.8 10*3/uL (ref 0.7–4.0)
MCH: 25.2 pg — ABNORMAL LOW (ref 26.0–34.0)
MCHC: 30.4 g/dL (ref 30.0–36.0)
MCV: 82.6 fL (ref 80.0–100.0)
Monocytes Absolute: 0.5 10*3/uL (ref 0.1–1.0)
Monocytes Relative: 5 %
Neutro Abs: 8.3 10*3/uL — ABNORMAL HIGH (ref 1.7–7.7)
Neutrophils Relative %: 68 %
Platelet Count: 360 10*3/uL (ref 150–400)
RBC: 4.61 MIL/uL (ref 3.87–5.11)
RDW: 15.1 % (ref 11.5–15.5)
WBC Count: 12 10*3/uL — ABNORMAL HIGH (ref 4.0–10.5)
nRBC: 0 % (ref 0.0–0.2)

## 2018-08-26 LAB — SEDIMENTATION RATE: Sed Rate: 30 mm/hr — ABNORMAL HIGH (ref 0–22)

## 2018-08-26 LAB — FOLATE: Folate: 29.9 ng/mL (ref >5.9)

## 2018-08-26 LAB — VITAMIN B12: Vitamin B-12: 3760 pg/mL — ABNORMAL HIGH (ref 180–914)

## 2018-08-27 ENCOUNTER — Other Ambulatory Visit: Payer: Self-pay | Admitting: Internal Medicine

## 2018-08-27 ENCOUNTER — Ambulatory Visit: Payer: Medicare Other | Admitting: Internal Medicine

## 2018-08-27 LAB — CMP (CANCER CENTER ONLY)
ALT: 15 U/L (ref 0–44)
AST: 17 U/L (ref 15–41)
Albumin: 3.4 g/dL — ABNORMAL LOW (ref 3.5–5.0)
Alkaline Phosphatase: 73 U/L (ref 38–126)
Anion gap: 9 (ref 5–15)
BUN: 10 mg/dL (ref 6–20)
CO2: 23 mmol/L (ref 22–32)
Calcium: 8.6 mg/dL — ABNORMAL LOW (ref 8.9–10.3)
Chloride: 107 mmol/L (ref 98–111)
Creatinine: 0.85 mg/dL (ref 0.44–1.00)
GFR, Est AFR Am: >60 mL/min (ref >60)
GFR, Estimated: >60 mL/min (ref >60)
Glucose, Bld: 91 mg/dL (ref 70–99)
Potassium: 4 mmol/L (ref 3.5–5.1)
Sodium: 139 mmol/L (ref 135–145)
Total Bilirubin: <0.2 mg/dL — ABNORMAL LOW (ref 0.3–1.2)
Total Protein: 7.4 g/dL (ref 6.5–8.1)

## 2018-08-27 LAB — METHYLMALONIC ACID, SERUM: Methylmalonic Acid, Quantitative: 65 nmol/L (ref 0–378)

## 2018-08-27 LAB — HEPATITIS C ANTIBODY: HCV Ab: 0.1 s/co ratio (ref 0.0–0.9)

## 2018-08-27 LAB — PROTEIN ELECTROPHORESIS, SERUM, WITH REFLEX
A/G Ratio: 0.9 (ref 0.7–1.7)
Albumin ELP: 3.3 g/dL (ref 2.9–4.4)
Alpha-1-Globulin: 0.3 g/dL (ref 0.0–0.4)
Alpha-2-Globulin: 0.7 g/dL (ref 0.4–1.0)
Beta Globulin: 1 g/dL (ref 0.7–1.3)
Gamma Globulin: 1.6 g/dL (ref 0.4–1.8)
Globulin, Total: 3.6 g/dL (ref 2.2–3.9)
Total Protein ELP: 6.9 g/dL (ref 6.0–8.5)

## 2018-08-27 LAB — IRON AND TIBC
Iron: 33 ug/dL — ABNORMAL LOW (ref 41–142)
Saturation Ratios: 12 % — ABNORMAL LOW (ref 21–57)
TIBC: 279 ug/dL (ref 236–444)
UIBC: 246 ug/dL (ref 120–384)

## 2018-08-27 LAB — HEPATITIS B SURFACE ANTIGEN: Hepatitis B Surface Ag: NEGATIVE

## 2018-08-27 LAB — FERRITIN: Ferritin: 25 ng/mL (ref 11–307)

## 2018-08-27 LAB — LACTATE DEHYDROGENASE: LDH: 176 U/L (ref 98–192)

## 2018-08-27 LAB — RHEUMATOID FACTOR: Rheumatoid fact SerPl-aCnc: <10 IU/mL (ref 0.0–13.9)

## 2018-08-27 LAB — HEPATITIS B SURFACE ANTIBODY,QUALITATIVE: Hep B S Ab: REACTIVE

## 2018-08-27 LAB — ANTINUCLEAR ANTIBODIES, IFA: ANA Ab, IFA: NEGATIVE

## 2018-08-27 LAB — HEPATITIS B CORE ANTIBODY, TOTAL: Hep B Core Total Ab: NEGATIVE

## 2018-08-27 LAB — HIV ANTIBODY (ROUTINE TESTING W REFLEX): HIV Screen 4th Generation wRfx: NONREACTIVE

## 2018-08-28 LAB — HEMOGLOBINOPATHY EVALUATION
Hgb A2 Quant: 1.2 % — ABNORMAL LOW (ref 1.8–3.2)
Hgb A: 97.7 % (ref 96.4–98.8)
Hgb C: 0 %
Hgb F Quant: 0 % (ref 0.0–2.0)
Hgb S Quant: 0 %
Hgb Variant: 1.1 % — ABNORMAL HIGH

## 2018-09-04 ENCOUNTER — Encounter: Payer: Self-pay | Admitting: Internal Medicine

## 2018-09-04 ENCOUNTER — Inpatient Hospital Stay (HOSPITAL_BASED_OUTPATIENT_CLINIC_OR_DEPARTMENT_OTHER): Payer: Medicare Other | Admitting: Internal Medicine

## 2018-09-04 DIAGNOSIS — D509 Iron deficiency anemia, unspecified: Secondary | ICD-10-CM

## 2018-09-04 DIAGNOSIS — D508 Other iron deficiency anemias: Secondary | ICD-10-CM | POA: Diagnosis not present

## 2018-09-04 HISTORY — DX: Iron deficiency anemia, unspecified: D50.9

## 2018-09-04 NOTE — Progress Notes (Signed)
Virtual Visit via Telephone Note  I connected with Natalie Buchanan on 09/04/18 at 10:30 AM EDT by telephone and verified that I am speaking with the correct person using two identifiers.   I discussed the limitations, risks, security and privacy concerns of performing an evaluation and management service by telephone and the availability of in person appointments. I also discussed with the patient that there may be a patient responsible charge related to this service. The patient expressed understanding and agreed to proceed.  Interval History:  Historical data obtained from note dated 08/13/2018.  43 year old female referred for evaluation due to leukocytosis.  Pt reports joint discomfort and is occasionally treated with pulse steroids. She reports she last took Prednisone in February.  She denies fevers, chills, night sweats and has noted no adenopathy.  She denies smoking.  Labs done 09/14/2016 showed WBC 8 HB 12.7 plts 307,000.  Normal differential.  1Labs done 07/24/2017 showed WBC 10 HB 12.4.  Labs done 05/15/2018 showed WBC 15 HB 11.2 pts 365,000.  Labs done 07/02/2018 showed WBC 11.6 HB 11.6 plts 398,000.  Pt denies new medications or smoking.  She denies family history of leukemias or lymphomas.   Observations/Objective: Review of labs from 08/26/2018.    Assessment and Plan: 1.  Leucocytosis.  43 year old female referred for evaluation due to leukocytosis.  Pt reports joint discomfort and is occasionally treated with pulse steroids. She reports she last took Prednisone in February.  She denies fevers, chills, night sweats and has noted no adenopathy.  She denies smoking.  Labs done 09/14/2016 showed WBC 8 HB 12.7 plts 307,000.  Normal differential.  1Labs done 07/24/2017 showed WBC 10 HB 12.4.  Labs done 05/15/2018 showed WBC 15 HB 11.2 pts 365,000.  Labs done 07/02/2018 showed WBC 11.6 HB 11.6 plts 398,000.  Pt denies new medications or smoking.  She denies family history of leukemias or lymphomas.     Discussed with pt potential etiologies of mild elevation in WBC count.  She does pulse steroids which are likely to cause leucocytosis.  Pt has negative Hepatitis panel and HIV.  She has negative BCR/ABL.   Pt will have  Repeat labs in 10/2018.  All questions answered and pt expressed understanding of information presented.    2.  Iron deficiency anemia.  HB is 11.6.  Ferritin is decreased at 25.  Pt has an intolerance to oral iron.  She is set up for Feraheme 510 mg IV D1 and D8.  Side effects of IV iron discussed with pt and include potential allergic reaction.  Pt is referred to GI for evaluation.  She will have repeat labs in 10/2018 for follow-up after IV iron.    3.  Joint pain.  Pt reports she takes pulse steroids.  Pt has mildly elevated sed rate of 30.  She has negative ANA, RF and SPEP.   Follow-up with PCP if ongoing symptoms.    4.  Microcytosis.  Labs done 07/02/2018 showed HB 11.6 with MCV of 81.  Ferritin is low normal at 25 which could explain microcytosis.  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  This is the likely explanation of microcytosis if it persists after treatment for iron deficiency.  This is an asymptomatic condition.  5.  Bipolar disorder, anxiety and depression.  Follow-up with PCP or psychiatry as directed.   6.  Health maintenance. Mammogram screenings as recommended.  Pt referred to GI due to IDA.  Follow Up Instructions: Schedule for IV iron.  Pt will follow-up in 10/2018 with labs.      I discussed the assessment and treatment plan with the patient. The patient was provided an opportunity to ask questions and all were answered. The patient agreed with the plan and demonstrated an understanding of the instructions.   The patient was advised to call back or seek an in-person evaluation if the symptoms worsen or if the condition fails to improve as anticipated.  I provided 25 minutes of non-face-to-face time during  this encounter.   Ahmed PrimaVetta Allecia Bells, MD

## 2018-09-05 LAB — BCR ABL1 FISH (GENPATH)

## 2018-09-08 ENCOUNTER — Telehealth: Payer: Self-pay | Admitting: Internal Medicine

## 2018-09-08 NOTE — Telephone Encounter (Signed)
Tried to reach regarding schedule °

## 2018-09-10 ENCOUNTER — Telehealth: Payer: Self-pay | Admitting: Internal Medicine

## 2018-09-10 NOTE — Telephone Encounter (Signed)
Called patient per 4/29 sch message - unable to reach patient . Left message for patient to call back to reschedule.   

## 2018-09-11 ENCOUNTER — Other Ambulatory Visit: Payer: Self-pay

## 2018-09-11 ENCOUNTER — Inpatient Hospital Stay: Payer: Medicare Other

## 2018-09-11 VITALS — BP 111/71 | HR 88 | Temp 98.4°F | Resp 20

## 2018-09-11 DIAGNOSIS — D508 Other iron deficiency anemias: Secondary | ICD-10-CM

## 2018-09-11 MED ORDER — SODIUM CHLORIDE 0.9 % IV SOLN
510.0000 mg | Freq: Once | INTRAVENOUS | Status: AC
  Administered 2018-09-11: 510 mg via INTRAVENOUS
  Filled 2018-09-11: qty 17

## 2018-09-11 MED ORDER — SODIUM CHLORIDE 0.9 % IV SOLN
Freq: Once | INTRAVENOUS | Status: AC
  Administered 2018-09-11: 14:00:00 via INTRAVENOUS
  Filled 2018-09-11: qty 250

## 2018-09-11 NOTE — Patient Instructions (Signed)

## 2018-09-18 ENCOUNTER — Other Ambulatory Visit: Payer: Self-pay

## 2018-09-18 ENCOUNTER — Ambulatory Visit: Payer: Medicare Other

## 2018-09-18 ENCOUNTER — Inpatient Hospital Stay: Payer: Medicare Other | Attending: Internal Medicine

## 2018-09-18 VITALS — BP 132/73 | HR 95 | Temp 97.8°F | Resp 18

## 2018-09-18 DIAGNOSIS — D508 Other iron deficiency anemias: Secondary | ICD-10-CM | POA: Diagnosis not present

## 2018-09-18 MED ORDER — SODIUM CHLORIDE 0.9 % IV SOLN
Freq: Once | INTRAVENOUS | Status: AC
  Administered 2018-09-18: 16:00:00 via INTRAVENOUS
  Filled 2018-09-18: qty 250

## 2018-09-18 MED ORDER — SODIUM CHLORIDE 0.9 % IV SOLN
510.0000 mg | Freq: Once | INTRAVENOUS | Status: AC
  Administered 2018-09-18: 510 mg via INTRAVENOUS
  Filled 2018-09-18: qty 510

## 2018-09-18 NOTE — Patient Instructions (Signed)

## 2018-11-05 ENCOUNTER — Inpatient Hospital Stay: Payer: Medicare Other

## 2018-11-05 ENCOUNTER — Inpatient Hospital Stay: Payer: Medicare Other | Admitting: Internal Medicine

## 2018-11-05 ENCOUNTER — Other Ambulatory Visit: Payer: Medicare Other

## 2018-11-05 ENCOUNTER — Ambulatory Visit: Payer: Medicare Other | Admitting: Internal Medicine

## 2018-11-12 ENCOUNTER — Inpatient Hospital Stay (HOSPITAL_BASED_OUTPATIENT_CLINIC_OR_DEPARTMENT_OTHER): Payer: Medicare Other | Admitting: Internal Medicine

## 2018-11-12 ENCOUNTER — Inpatient Hospital Stay: Payer: Medicare Other | Attending: Internal Medicine

## 2018-11-12 ENCOUNTER — Telehealth: Payer: Self-pay | Admitting: Internal Medicine

## 2018-11-12 ENCOUNTER — Other Ambulatory Visit: Payer: Self-pay

## 2018-11-12 VITALS — BP 132/76 | HR 86 | Temp 98.7°F | Resp 18 | Ht 62.0 in | Wt 286.3 lb

## 2018-11-12 DIAGNOSIS — D72829 Elevated white blood cell count, unspecified: Secondary | ICD-10-CM

## 2018-11-12 DIAGNOSIS — D508 Other iron deficiency anemias: Secondary | ICD-10-CM

## 2018-11-12 DIAGNOSIS — F419 Anxiety disorder, unspecified: Secondary | ICD-10-CM

## 2018-11-12 DIAGNOSIS — M2550 Pain in unspecified joint: Secondary | ICD-10-CM

## 2018-11-12 DIAGNOSIS — Z888 Allergy status to other drugs, medicaments and biological substances status: Secondary | ICD-10-CM | POA: Insufficient documentation

## 2018-11-12 DIAGNOSIS — Z79899 Other long term (current) drug therapy: Secondary | ICD-10-CM

## 2018-11-12 DIAGNOSIS — F319 Bipolar disorder, unspecified: Secondary | ICD-10-CM

## 2018-11-12 DIAGNOSIS — R700 Elevated erythrocyte sedimentation rate: Secondary | ICD-10-CM | POA: Diagnosis not present

## 2018-11-12 DIAGNOSIS — N809 Endometriosis, unspecified: Secondary | ICD-10-CM

## 2018-11-12 DIAGNOSIS — Z8049 Family history of malignant neoplasm of other genital organs: Secondary | ICD-10-CM | POA: Insufficient documentation

## 2018-11-12 DIAGNOSIS — Z803 Family history of malignant neoplasm of breast: Secondary | ICD-10-CM

## 2018-11-12 LAB — CBC WITH DIFFERENTIAL (CANCER CENTER ONLY)
Abs Immature Granulocytes: 0.04 10*3/uL (ref 0.00–0.07)
Basophils Absolute: 0 10*3/uL (ref 0.0–0.1)
Basophils Relative: 0 %
Eosinophils Absolute: 0.2 10*3/uL (ref 0.0–0.5)
Eosinophils Relative: 2 %
HCT: 41.9 % (ref 36.0–46.0)
Hemoglobin: 13.2 g/dL (ref 12.0–15.0)
Immature Granulocytes: 0 %
Lymphocytes Relative: 23 %
Lymphs Abs: 2.6 10*3/uL (ref 0.7–4.0)
MCH: 26.8 pg (ref 26.0–34.0)
MCHC: 31.5 g/dL (ref 30.0–36.0)
MCV: 85.2 fL (ref 80.0–100.0)
Monocytes Absolute: 0.5 10*3/uL (ref 0.1–1.0)
Monocytes Relative: 4 %
Neutro Abs: 7.8 10*3/uL — ABNORMAL HIGH (ref 1.7–7.7)
Neutrophils Relative %: 71 %
Platelet Count: 354 10*3/uL (ref 150–400)
RBC: 4.92 MIL/uL (ref 3.87–5.11)
RDW: 16.1 % — ABNORMAL HIGH (ref 11.5–15.5)
WBC Count: 11 10*3/uL — ABNORMAL HIGH (ref 4.0–10.5)
nRBC: 0 % (ref 0.0–0.2)

## 2018-11-12 LAB — CMP (CANCER CENTER ONLY)
ALT: 19 U/L (ref 0–44)
AST: 17 U/L (ref 15–41)
Albumin: 3.6 g/dL (ref 3.5–5.0)
Alkaline Phosphatase: 83 U/L (ref 38–126)
Anion gap: 10 (ref 5–15)
BUN: 6 mg/dL (ref 6–20)
CO2: 23 mmol/L (ref 22–32)
Calcium: 8.9 mg/dL (ref 8.9–10.3)
Chloride: 107 mmol/L (ref 98–111)
Creatinine: 0.92 mg/dL (ref 0.44–1.00)
GFR, Est AFR Am: >60 mL/min (ref >60)
GFR, Estimated: >60 mL/min (ref >60)
Glucose, Bld: 105 mg/dL — ABNORMAL HIGH (ref 70–99)
Potassium: 4 mmol/L (ref 3.5–5.1)
Sodium: 140 mmol/L (ref 135–145)
Total Bilirubin: 0.2 mg/dL — ABNORMAL LOW (ref 0.3–1.2)
Total Protein: 7.8 g/dL (ref 6.5–8.1)

## 2018-11-12 LAB — FERRITIN: Ferritin: 216 ng/mL (ref 11–307)

## 2018-11-12 LAB — LACTATE DEHYDROGENASE: LDH: 171 U/L (ref 98–192)

## 2018-11-12 NOTE — Telephone Encounter (Signed)
Will schedule appt once MD template is available

## 2018-11-12 NOTE — Progress Notes (Signed)
Diagnosis Other iron deficiency anemia - Plan: Ambulatory referral to Gastroenterology, CBC with Differential ([place] Only), CMP ([place] only), Lactate dehydrogenase (LDH), Ferritin  Staging Cancer Staging No matching staging information was found for the patient.  Assessment and Plan:  1.  Leucocytosis.  43 year old female referred for evaluation due to leukocytosis.  Pt reports joint discomfort and is occasionally treated with pulse steroids. She reports she last took Prednisone in February.  She denies fevers, chills, night sweats and has noted no adenopathy.  She denies smoking.  Labs done 09/14/2016 showed WBC 8 HB 12.7 plts 307,000.  Normal differential.  1Labs done 07/24/2017 showed WBC 10 HB 12.4.  Labs done 05/15/2018 showed WBC 15 HB 11.2 pts 365,000.  Labs done 07/02/2018 showed WBC 11.6 HB 11.6 plts 398,000.  Pt denies new medications or smoking.  She denies family history of leukemias or lymphomas.    Discussed with pt potential etiologies of mild elevation in WBC count.  She does pulse steroids which are likely potential cause of leucocytosis.  Pt has negative Hepatitis panel and HIV.  She has negative BCR/ABL.     Labs done 11/12/2018 reviewed and showed WBC 11 HB 13.2 plts 354,000.  Chemistries showed K+ 4 Cr 0.92 and normal LFTs.  Ferritin improved at 216.  LDH WNL at 171.    WBC improved.  Suspect normal variant or due to steroid use in the past.  Will RTC in 04/2019 for follow-up and repeat labs.    2.  Iron deficiency anemia.  HB is 11.6.  Ferritin is decreased at 25.  Pt has an intolerance to oral iron.  She is set up for Feraheme 510 mg IV D1 and D8.    Pt was treated with Feraheme on 09/11/2018 and 09/18/2018.    Labs done 11/12/2018 reviewed and showed WBC 11 HB 13.2 plts 354,000.  Chemistries showed K+ 4 Cr 0.92 and normal LFTs.  Ferritin improved at 216.    HB improved at 13.  Ferritin improved at 216.  Pt is referred to GI for evaluation.  Pt will RTC in 04/2019 for  follow-up and repeat labs.    3.  Joint pain.  Pt reports she takes pulse steroids.  Pt has mildly elevated sed rate of 30.  She has negative ANA, RF and SPEP.   Follow-up with PCP if ongoing symptoms.    4.  Microcytosis.  Labs done 07/02/2018 showed HB 11.6 with MCV of 81.  Ferritin is low normal at 25 which could explain microcytosis.  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  This is the likely explanation of microcytosis if it persists after treatment for iron deficiency.  This is an asymptomatic condition.MCV improved at 85 after IV iron.    5.  Bipolar disorder, anxiety and depression.  Follow-up with PCP or psychiatry as directed.   6.  Health maintenance. Mammogram screenings as recommended.  Pt referred to GI due to IDA.    25 minutes spent with more than 50% spent in review of records, counseling and coordination of care.    Interval History:  Historical data obtained from note dated 09/04/2018.  43 year old female referred for evaluation due to leukocytosis.  Pt reports joint discomfort and is occasionally treated with pulse steroids. She reports she last took Prednisone in February.  She denies fevers, chills, night sweats and has noted no adenopathy.  She denies smoking.  Labs done 09/14/2016 showed WBC 8  HB 12.7 plts 307,000.  Normal differential.  1Labs done 07/24/2017 showed WBC 10 HB 12.4.  Labs done 05/15/2018 showed WBC 15 HB 11.2 pts 365,000.  Labs done 07/02/2018 showed WBC 11.6 HB 11.6 plts 398,000.  Pt denies new medications or smoking.  She denies family history of leukemias or lymphomas.   Current Status:    Pt is seen today for follow-up after IV iron.  She is here to go over labs.     Oncology History   No history exists.     Problem List Patient Active Problem List   Diagnosis Date Noted  . Iron deficiency anemia [D50.9] 09/04/2018  . Endometriosis [N80.9]   . IUD (intrauterine device) in place [Z97.5]     Past Medical  History Past Medical History:  Diagnosis Date  . Endometriosis   . Iron deficiency anemia 09/04/2018  . IUD (intrauterine device) in place     Past Surgical History Past Surgical History:  Procedure Laterality Date  . CHOLECYSTECTOMY    . TUBAL LIGATION      Family History Family History  Problem Relation Age of Onset  . Breast cancer Mother   . Breast cancer Sister   . Cancer - Cervical Sister   . Leukemia Neg Hx   . Lymphoma Neg Hx      Social History  reports that she is a non-smoker but has been exposed to tobacco smoke. She has never used smokeless tobacco. She reports that she does not drink alcohol or use drugs.  Medications  Current Outpatient Medications:  .  Brexpiprazole (REXULTI) 0.5 MG TABS, TK 1 T PO HS UTD  psych prescribes, Disp: , Rfl:  .  cyclobenzaprine (FLEXERIL) 10 MG tablet, Take 1 tablet (10 mg total) by mouth 2 (two) times daily as needed for muscle spasms., Disp: 10 tablet, Rfl: 0 .  diclofenac (VOLTAREN) 75 MG EC tablet, Take 75 mg by mouth 2 (two) times daily., Disp: , Rfl:  .  meloxicam (MOBIC) 15 MG tablet, Take 15 mg by mouth daily., Disp: , Rfl:  .  oxyCODONE-acetaminophen (PERCOCET) 5-325 MG tablet, Take 1-2 tablets by mouth every 6 (six) hours as needed., Disp: 15 tablet, Rfl: 0 .  polyethylene glycol (MIRALAX / GLYCOLAX) packet, Take 17 g by mouth daily., Disp: , Rfl:  .  predniSONE (STERAPRED UNI-PAK 21 TAB) 10 MG (21) TBPK tablet, Take by mouth daily. Take 6 tabs by mouth daily  for 2 days, then 5 tabs for 2 days, then 4 tabs for 2 days, then 3 tabs for 2 days, 2 tabs for 2 days, then 1 tab by mouth daily for 2 days, Disp: 42 tablet, Rfl: 0 .  buPROPion (WELLBUTRIN XL) 150 MG 24 hr tablet, Take by mouth., Disp: , Rfl:  .  buPROPion (WELLBUTRIN XL) 300 MG 24 hr tablet, Take by mouth., Disp: , Rfl:  .  doxepin (SINEQUAN) 10 MG capsule, TK 1 C PO HS FOR SLP OR INSOMNIA, Disp: , Rfl:  .  gabapentin (NEURONTIN) 400 MG capsule, TAKE 1 CAPSULE BY  MOUTH NIGHTLY AS NEEDED FOR PAINFUL MENSTRUATION, Disp: , Rfl:   Allergies Duloxetine hcl and Linaclotide  Review of Systems Review of Systems - Oncology ROS negative   Physical Exam  Vitals Wt Readings from Last 3 Encounters:  11/12/18 286 lb 4.8 oz (129.9 kg)  08/13/18 273 lb 8 oz (124.1 kg)  08/09/18 160 lb (72.6 kg)   Temp Readings from Last 3 Encounters:  11/12/18 98.7 F (37.1  C) (Oral)  09/18/18 97.8 F (36.6 C) (Oral)  09/11/18 98.4 F (36.9 C) (Oral)   BP Readings from Last 3 Encounters:  11/12/18 132/76  09/18/18 132/73  09/11/18 111/71   Pulse Readings from Last 3 Encounters:  11/12/18 86  09/18/18 95  09/11/18 88   Constitutional: Well-developed, well-nourished, and in no distress.   HENT: Head: Normocephalic and atraumatic.  Mouth/Throat: No oropharyngeal exudate. Mucosa moist. Eyes: Pupils are equal, round, and reactive to light. Conjunctivae are normal. No scleral icterus.  Neck: Normal range of motion. Neck supple. No JVD present.  Cardiovascular: Normal rate, regular rhythm and normal heart sounds.  Exam reveals no gallop and no friction rub.   No murmur heard. Pulmonary/Chest: Effort normal and breath sounds normal. No respiratory distress. No wheezes.No rales.  Abdominal: Soft. Bowel sounds are normal. No distension. There is no tenderness. There is no guarding.  Musculoskeletal: No edema or tenderness.  Lymphadenopathy: No cervical, axillary or supraclavicular adenopathy.  Neurological: Alert and oriented to person, place, and time. No cranial nerve deficit.  Skin: Skin is warm and dry. No rash noted. No erythema. No pallor.  Psychiatric: Affect and judgment normal.   Labs Appointment on 11/12/2018  Component Date Value Ref Range Status  . WBC Count 11/12/2018 11.0* 4.0 - 10.5 K/uL Final  . RBC 11/12/2018 4.92  3.87 - 5.11 MIL/uL Final  . Hemoglobin 11/12/2018 13.2  12.0 - 15.0 g/dL Final  . HCT 82/95/621307/05/2018 41.9  36.0 - 46.0 % Final  . MCV  11/12/2018 85.2  80.0 - 100.0 fL Final  . MCH 11/12/2018 26.8  26.0 - 34.0 pg Final  . MCHC 11/12/2018 31.5  30.0 - 36.0 g/dL Final  . RDW 08/65/784607/05/2018 16.1* 11.5 - 15.5 % Final  . Platelet Count 11/12/2018 354  150 - 400 K/uL Final  . nRBC 11/12/2018 0.0  0.0 - 0.2 % Final  . Neutrophils Relative % 11/12/2018 71  % Final  . Neutro Abs 11/12/2018 7.8* 1.7 - 7.7 K/uL Final  . Lymphocytes Relative 11/12/2018 23  % Final  . Lymphs Abs 11/12/2018 2.6  0.7 - 4.0 K/uL Final  . Monocytes Relative 11/12/2018 4  % Final  . Monocytes Absolute 11/12/2018 0.5  0.1 - 1.0 K/uL Final  . Eosinophils Relative 11/12/2018 2  % Final  . Eosinophils Absolute 11/12/2018 0.2  0.0 - 0.5 K/uL Final  . Basophils Relative 11/12/2018 0  % Final  . Basophils Absolute 11/12/2018 0.0  0.0 - 0.1 K/uL Final  . Immature Granulocytes 11/12/2018 0  % Final  . Abs Immature Granulocytes 11/12/2018 0.04  0.00 - 0.07 K/uL Final   Performed at [facility], [address] [place], [place] [address]  . Ferritin 11/12/2018 216  11 - 307 ng/mL Final   Performed at [facility], [address] [place], [place] [address]  . Sodium 11/12/2018 140  135 - 145 mmol/L Final  . Potassium 11/12/2018 4.0  3.5 - 5.1 mmol/L Final  . Chloride 11/12/2018 107  98 - 111 mmol/L Final  . CO2 11/12/2018 23  22 - 32 mmol/L Final  . Glucose, Bld 11/12/2018 105* 70 - 99 mg/dL Final  . BUN 24/40/102707/05/2018 6  6 - 20 mg/dL Final  . Creatinine 25/36/644007/05/2018 0.92  0.44 - 1.00 mg/dL Final  . Calcium 34/74/259507/05/2018 8.9  8.9 - 10.3 mg/dL Final  . Total Protein 11/12/2018 7.8  6.5 - 8.1 g/dL Final  . Albumin 63/87/564307/05/2018 3.6  3.5 - 5.0 g/dL Final  . AST 96/04/540907/05/2018 17  15 - 41 U/L Final  . ALT 11/12/2018 19  0 - 44 U/L Final  . Alkaline Phosphatase 11/12/2018 83  38 - 126 U/L Final  . Total Bilirubin 11/12/2018 0.2* 0.3 - 1.2 mg/dL Final  . GFR, Est Non Af Am 11/12/2018 >60  >60 mL/min Final  . GFR, Est AFR Am  11/12/2018 >60  >60 mL/min Final  . Anion gap 11/12/2018 10  5 - 15 Final   Performed at [facility], [address] [place], [place] [address]  . LDH 11/12/2018 171  98 - 192 U/L Final   Performed at [facility], [address] [place], [place] [address]     Pathology Orders Placed This Encounter  Procedures  . CBC with Differential (Cancer Center Only)    Standing Status:   Future    Standing Expiration Date:   11/12/2019  . CMP (Cancer Center only)    Standing Status:   Future    Standing Expiration Date:   11/12/2019  . Lactate dehydrogenase (LDH)    Standing Status:   Future    Standing Expiration Date:   11/12/2019  . Ferritin    Standing Status:   Future    Standing Expiration Date:   11/12/2019  . Ambulatory referral to Gastroenterology    Standing Status:   Future    Standing Expiration Date:   11/12/2019    Referral Priority:   Routine    Referral Type:   Consultation    Referral Reason:   Specialty Services Required    Number of Visits Requested:   1       Ahmed PrimaVetta Devonia Farro MD

## 2018-11-13 ENCOUNTER — Other Ambulatory Visit: Payer: Self-pay

## 2018-11-13 ENCOUNTER — Emergency Department (HOSPITAL_COMMUNITY): Payer: Medicare Other

## 2018-11-13 ENCOUNTER — Emergency Department (HOSPITAL_COMMUNITY)
Admission: EM | Admit: 2018-11-13 | Discharge: 2018-11-14 | Disposition: A | Discharge status: Home / Self Care | Payer: Medicare Other | Source: Home / Self Care | Attending: Emergency Medicine | Admitting: Emergency Medicine

## 2018-11-13 ENCOUNTER — Encounter (HOSPITAL_COMMUNITY): Payer: Self-pay | Admitting: Emergency Medicine

## 2018-11-13 ENCOUNTER — Emergency Department (HOSPITAL_COMMUNITY)
Admission: EM | Admit: 2018-11-13 | Discharge: 2018-11-13 | Disposition: A | Discharge status: Home / Self Care | Payer: Medicare Other | Attending: Emergency Medicine | Admitting: Emergency Medicine

## 2018-11-13 ENCOUNTER — Encounter (HOSPITAL_COMMUNITY): Payer: Self-pay | Admitting: *Deleted

## 2018-11-13 DIAGNOSIS — M79601 Pain in right arm: Secondary | ICD-10-CM

## 2018-11-13 DIAGNOSIS — Z79899 Other long term (current) drug therapy: Secondary | ICD-10-CM | POA: Insufficient documentation

## 2018-11-13 DIAGNOSIS — Z87891 Personal history of nicotine dependence: Secondary | ICD-10-CM | POA: Diagnosis not present

## 2018-11-13 DIAGNOSIS — Z9049 Acquired absence of other specified parts of digestive tract: Secondary | ICD-10-CM | POA: Diagnosis not present

## 2018-11-13 MED ORDER — DIAZEPAM 5 MG PO TABS
10.0000 mg | ORAL_TABLET | Freq: Once | ORAL | Status: AC
  Administered 2018-11-13: 10 mg via ORAL
  Filled 2018-11-13: qty 2

## 2018-11-13 MED ORDER — CYCLOBENZAPRINE HCL 10 MG PO TABS: 10.0000 mg | ORAL_TABLET | Freq: Three times a day (TID) | ORAL | 0 refills | Status: AC

## 2018-11-13 MED ORDER — HYDROMORPHONE HCL 1 MG/ML IJ SOLN
1.0000 mg | Freq: Once | INTRAMUSCULAR | Status: AC
  Administered 2018-11-14: 1 mg via INTRAMUSCULAR
  Filled 2018-11-13: qty 1

## 2018-11-13 MED ORDER — OXYCODONE-ACETAMINOPHEN 5-325 MG PO TABS: 1.0000 | ORAL_TABLET | ORAL | 0 refills | Status: DC | PRN

## 2018-11-13 MED ORDER — HYDROMORPHONE HCL 1 MG/ML IJ SOLN
1.0000 mg | Freq: Once | INTRAMUSCULAR | Status: AC
  Administered 2018-11-13: 02:00:00 1 mg via INTRAMUSCULAR
  Filled 2018-11-13: qty 1

## 2018-11-13 NOTE — ED Triage Notes (Signed)
Pt arrives with right shoulder pain that radiates down the arm to the right antecubital area. Reports hx of bursitis, says feels similar. States seen here last night, has taken the prescribed meds without relief.

## 2018-11-13 NOTE — ED Triage Notes (Signed)
Pt reports right arm pain, X 1 week.  This has happened before, no relief.

## 2018-11-13 NOTE — ED Notes (Signed)
Pt ambulated with steady gait belt.

## 2018-11-13 NOTE — ED Provider Notes (Signed)
[facility] EMERGENCY [facility]PARTMENT Provider Note   CSN: [id number] Arrival date & time: 11/13/18  0036     History   Chief Complaint Chief Complaint  Patient presents with  . Arm Pain    HPI Tomi BambergerLatisha Solorzano is a 43 y.o. female.     Patient to ED with complaint of sharp, intense right arm pain that started last week. She has had similar symptoms previously. No numbness or weakness. She denies neck pain. No swelling or redness. The pain starts at the proximal shoulder and radiates down the arm medially, usually to the elbow. Tonight she reports the pain extended to her hand, involving all 5 digits. She has been taking ibuprofen and Tylenol without relief. She can only find certain positions where the pain decreases. No chest pain, Headache, SOB.  The history is provided by the patient. No language interpreter was used.  Arm Pain Pertinent negatives include no chest pain and no shortness of breath.    Past Medical History:  Diagnosis Date  . Endometriosis   . Iron deficiency anemia 09/04/2018  . IUD (intrauterine device) in place     Patient Active Problem List   Diagnosis Date Noted  . Iron deficiency anemia 09/04/2018  . Endometriosis   . IUD (intrauterine device) in place     Past Surgical History:  Procedure Laterality Date  . CHOLECYSTECTOMY    . TUBAL LIGATION       OB History   No obstetric history on file.      Home Medications    Prior to Admission medications   Medication Sig Start Date End Date Taking? Authorizing Provider  Brexpiprazole (REXULTI) 0.5 MG TABS TK 1 T PO HS UTD  psych prescribes 06/07/17   [provider]  buPROPion (WELLBUTRIN XL) 150 MG 24 hr tablet Take by mouth. 08/02/16 08/09/18  [provider]  buPROPion (WELLBUTRIN XL) 300 MG 24 hr tablet Take by mouth. 02/13/15 08/09/18  [provider]  cyclobenzaprine (FLEXERIL) 10 MG tablet Take 1 tablet (10 mg total) by mouth 2 (two) times daily as needed  for muscle spasms. 02/16/18   Fawze, Mina A, PA-C  diclofenac (VOLTAREN) 75 MG EC tablet Take 75 mg by mouth 2 (two) times daily.    [provider]  doxepin (SINEQUAN) 10 MG capsule TK 1 C PO HS FOR SLP OR INSOMNIA 06/06/17 08/09/18  [provider]  gabapentin (NEURONTIN) 400 MG capsule TAKE 1 CAPSULE BY MOUTH NIGHTLY AS NEEDED FOR PAINFUL MENSTRUATION 05/30/18 08/09/18  [provider]  meloxicam (MOBIC) 15 MG tablet Take 15 mg by mouth daily.    [provider]  oxyCODONE-acetaminophen (PERCOCET) 5-325 MG tablet Take 1-2 tablets by mouth every 6 (six) hours as needed. 02/17/18   Geoffery Lyonselo, Douglas, MD  polyethylene glycol (MIRALAX / GLYCOLAX) packet Take 17 g by mouth daily.    [provider]  predniSONE (STERAPRED UNI-PAK 21 TAB) 10 MG (21) TBPK tablet Take by mouth daily. Take 6 tabs by mouth daily  for 2 days, then 5 tabs for 2 days, then 4 tabs for 2 days, then 3 tabs for 2 days, 2 tabs for 2 days, then 1 tab by mouth daily for 2 days 02/16/18   Michela PitcherFawze, Mina A, PA-C  topiramate (TOPAMAX) 100 MG tablet Take by mouth. 07/31/16 01/12/18  [provider]    Family History Family History  Problem Relation Age of Onset  . Breast cancer Mother   . Breast cancer Sister   .  Cancer - Cervical Sister   . Leukemia Neg Hx   . Lymphoma Neg Hx     Social History Social History   Tobacco Use  . Smoking status: Passive Smoke Exposure - Never Smoker  . Smokeless tobacco: Never Used  Substance Use Topics  . Alcohol use: Never    Frequency: Never  . Drug use: Never     Allergies   Duloxetine hcl and Linaclotide   Review of Systems Review of Systems  Constitutional: Negative for diaphoresis.  Respiratory: Negative.  Negative for shortness of breath.   Cardiovascular: Negative.  Negative for chest pain.  Gastrointestinal: Negative.  Negative for nausea.  Musculoskeletal:       See HPI.  Skin: Negative.  Negative for color change.  Neurological:  Negative.  Negative for weakness and numbness.     Physical Exam Updated Vital Signs BP 124/87 (BP Location: Left Arm)   Pulse (!) 103   Temp 98.3 F (36.8 C) (Oral)   Resp 18   SpO2 100%   Physical Exam Constitutional:      Appearance: She is well-developed.  HENT:     Head: Normocephalic.  Neck:     Musculoskeletal: Normal range of motion and neck supple.  Cardiovascular:     Rate and Rhythm: Normal rate and regular rhythm.     Pulses: Normal pulses.  Pulmonary:     Effort: Pulmonary effort is normal.     Breath sounds: Normal breath sounds. No wheezing, rhonchi or rales.  Abdominal:     General: Bowel sounds are normal.     Palpations: Abdomen is soft.     Tenderness: There is no abdominal tenderness. There is no guarding or rebound.  Musculoskeletal: Normal range of motion.     Comments: Right arm held in adduction, supported by left. FROM with pain on movement. Symmetric grip strength. Cap RF >2s. No midline cervical spinal tenderness. Pressure applied to the right paracervical spine elicits pain into the right arm.   Skin:    General: Skin is warm and dry.     Findings: No rash.  Neurological:     Mental Status: She is alert and oriented to person, place, and time.     Sensory: No sensory deficit.     Motor: No weakness.     Coordination: Coordination normal.     Deep Tendon Reflexes: Reflexes normal.      ED Treatments / Results  Labs (all labs ordered are listed, but only abnormal results are displayed) Labs Reviewed - No data to display  EKG None  Radiology Mr Cervical Spine Wo Contrast  Result Date: 11/13/2018 CLINICAL DATA:  43 y/o  F; 1 week of right arm pain. EXAM: MRI CERVICAL SPINE WITHOUT CONTRAST TECHNIQUE: Multiplanar, multisequence MR imaging of the cervical spine was performed. No intravenous contrast was administered. COMPARISON:  None. FINDINGS: Alignment: Straightening of cervical lordosis without listhesis. Vertebrae: No fracture,  evidence of discitis, or bone lesion. Cord: Normal signal and morphology. Posterior Fossa, vertebral arteries, paraspinal tissues: Negative. Disc levels: No significant disc displacement, foraminal stenosis, or canal stenosis. IMPRESSION: No finding as explanation for pain. Unremarkable cervical spine MRI. Electronically Signed   By: Kristine Garbe M.D.   On: 11/13/2018 03:41    Procedures Procedures (including critical care time)  Medications Ordered in ED Medications  HYDROmorphone (DILAUDID) injection 1 mg (1 mg Intramuscular Given 11/13/18 0156)  diazepam (VALIUM) tablet 10 mg (10 mg Oral Given 11/13/18 0211)     Initial  Impression / Assessment and Plan / ED Course  I have reviewed the triage vital signs and the nursing notes.  Pertinent labs & imaging results that were available during my care of the patient were reviewed by me and considered in my medical decision making (see chart for details).        Patient to ED with c/o right arm pain described as sharp, intense and radiating from shoulder to wrist. Similar episodes in the past. No weakness or numbness.   No neurologic deficits on exam of pain concerning for cervical radiculopathy. Pain management provided. Will get MRI.  MRI negative for abnormal findings. Pain is improved with medications. Patient can be discharged home with PCP follow up for further management.   Final Clinical Impressions(s) / ED Diagnoses   Final diagnoses:  None   1. Right arm pain, recurrent  ED Discharge Orders    None       Elpidio AnisUpstill, Malick Netz, Cordelia Poche-C 11/13/18 0410    Mesner, Barbara CowerJason, MD 11/13/18 [id number]

## 2018-11-13 NOTE — Discharge Instructions (Signed)
Follow up with your doctor for recheck in 2-3 days. Take medications as prescribed.

## 2018-11-14 DIAGNOSIS — M79601 Pain in right arm: Secondary | ICD-10-CM | POA: Diagnosis not present

## 2018-11-14 MED ORDER — DICLOFENAC SODIUM 75 MG PO TBEC: 75.0000 mg | DELAYED_RELEASE_TABLET | Freq: Two times a day (BID) | ORAL | 0 refills | Status: DC

## 2018-11-14 MED ORDER — OXYCODONE-ACETAMINOPHEN 5-325 MG PO TABS: 1.0000 | ORAL_TABLET | ORAL | 0 refills | Status: DC | PRN

## 2018-11-14 MED ORDER — KETOROLAC TROMETHAMINE 30 MG/ML IJ SOLN
60.0000 mg | Freq: Once | INTRAMUSCULAR | Status: AC
  Administered 2018-11-14: 60 mg via INTRAMUSCULAR
  Filled 2018-11-14: qty 2

## 2018-11-14 NOTE — Discharge Instructions (Addendum)
Continue medications as prescribed through the weekend. Keep in touch with your doctor Monday as to your referral to orthopedics.

## 2018-11-14 NOTE — ED Notes (Signed)
Pt discharged from ED; instructions provided and scripts given; Pt encouraged to return to ED if symptoms worsen and to f/u with PCP; Pt verbalized understanding of all instructions 

## 2018-11-14 NOTE — ED Provider Notes (Signed)
[facility] Provider Note   CSN: [id number] Arrival date & time: 11/13/18  2236     History   Chief Complaint Chief Complaint  Patient presents with  . Arm Pain    HPI Natalie Buchanan is a 43 y.o. female.     Patient to ED with right arm pain starting at her upper shoulder and radiating to elbow. No neck pain. Seen last night for same. She has had similar episodes in the past without known cause. She had a cervical spine MRI last night which was unremarkable. As instructed, she followed up with her primary care doctor today and is in the process of orthopedic referral. No new symptoms. No numbness or weakness of the arm.   The history is provided by the patient. No language interpreter was used.  Arm Pain Pertinent negatives include no chest pain and no shortness of breath.    Past Medical History:  Diagnosis Date  . Endometriosis   . Iron deficiency anemia 09/04/2018  . IUD (intrauterine device) in place     Patient Active Problem List   Diagnosis Date Noted  . Iron deficiency anemia 09/04/2018  . Endometriosis   . IUD (intrauterine device) in place     Past Surgical History:  Procedure Laterality Date  . CHOLECYSTECTOMY    . TUBAL LIGATION       OB History   No obstetric history on file.      Home Medications    Prior to Admission medications   Medication Sig Start Date End Date Taking? Authorizing Provider  Brexpiprazole (REXULTI) 0.5 MG TABS TK 1 T PO HS UTD  psych prescribes 06/07/17   [provider]  buPROPion (WELLBUTRIN XL) 150 MG 24 hr tablet Take by mouth. 08/02/16 08/09/18  [provider]  buPROPion (WELLBUTRIN XL) 300 MG 24 hr tablet Take by mouth. 02/13/15 08/09/18  [provider]  cyclobenzaprine (FLEXERIL) 10 MG tablet Take 1 tablet (10 mg total) by mouth 3 (three) times daily. 11/13/18   Elpidio AnisUpstill, Shukri Nistler, PA-C  diclofenac (VOLTAREN) 75 MG EC tablet Take 75 mg by mouth 2 (two) times  daily.    [provider]  doxepin (SINEQUAN) 10 MG capsule TK 1 C PO HS FOR SLP OR INSOMNIA 06/06/17 08/09/18  [provider]  gabapentin (NEURONTIN) 400 MG capsule TAKE 1 CAPSULE BY MOUTH NIGHTLY AS NEEDED FOR PAINFUL MENSTRUATION 05/30/18 08/09/18  [provider]  meloxicam (MOBIC) 15 MG tablet Take 15 mg by mouth daily.    [provider]  oxyCODONE-acetaminophen (PERCOCET) 5-325 MG tablet Take 1 tablet by mouth every 4 (four) hours as needed for severe pain. 11/13/18   Elpidio AnisUpstill, Brandin Dilday, PA-C  polyethylene glycol (MIRALAX / GLYCOLAX) packet Take 17 g by mouth daily.    [provider]  predniSONE (STERAPRED UNI-PAK 21 TAB) 10 MG (21) TBPK tablet Take by mouth daily. Take 6 tabs by mouth daily  for 2 days, then 5 tabs for 2 days, then 4 tabs for 2 days, then 3 tabs for 2 days, 2 tabs for 2 days, then 1 tab by mouth daily for 2 days 02/16/18   Michela PitcherFawze, Mina A, PA-C  topiramate (TOPAMAX) 100 MG tablet Take by mouth. 07/31/16 01/12/18  [provider]    Family History Family History  Problem Relation Age of Onset  . Breast cancer Mother   . Breast cancer Sister   . Cancer - Cervical Sister   . Leukemia Neg Hx   .  Lymphoma Neg Hx     Social History Social History   Tobacco Use  . Smoking status: Passive Smoke Exposure - Never Smoker  . Smokeless tobacco: Never Used  Substance Use Topics  . Alcohol use: Never    Frequency: Never  . Drug use: Never     Allergies   Duloxetine hcl and Linaclotide   Review of Systems Review of Systems  Constitutional: Negative for fever.  Respiratory: Negative.  Negative for shortness of breath.   Cardiovascular: Negative.  Negative for chest pain.  Gastrointestinal: Negative for nausea.  Musculoskeletal: Negative for neck pain.       See HPI.  Skin: Negative.   Neurological: Negative.  Negative for weakness and numbness.     Physical Exam Updated Vital Signs BP 131/88 (BP Location: Right Arm)    Pulse 77   Temp 98.1 F (36.7 C) (Oral)   Resp 18   SpO2 99%   Physical Exam Constitutional:      Appearance: She is well-developed.  Neck:     Musculoskeletal: Normal range of motion.  Pulmonary:     Effort: Pulmonary effort is normal.  Musculoskeletal:     Comments: No swelling of the right arm, shoulder or neck. Arm held in adduction as position of comfort but has full range of motion. No strength deficits. No midline cervical tenderness.   Skin:    General: Skin is warm and dry.  Neurological:     Mental Status: She is alert and oriented to person, place, and time.     Sensory: No sensory deficit.      ED Treatments / Results  Labs (all labs ordered are listed, but only abnormal results are displayed) Labs Reviewed - No data to display  EKG None  Radiology Mr Cervical Spine Wo Contrast  Result Date: 11/13/2018 CLINICAL DATA:  43 y/o  F; 1 week of right arm pain. EXAM: MRI CERVICAL SPINE WITHOUT CONTRAST TECHNIQUE: Multiplanar, multisequence MR imaging of the cervical spine was performed. No intravenous contrast was administered. COMPARISON:  None. FINDINGS: Alignment: Straightening of cervical lordosis without listhesis. Vertebrae: No fracture, evidence of discitis, or bone lesion. Cord: Normal signal and morphology. Posterior Fossa, vertebral arteries, paraspinal tissues: Negative. Disc levels: No significant disc displacement, foraminal stenosis, or canal stenosis. IMPRESSION: No finding as explanation for pain. Unremarkable cervical spine MRI. Electronically Signed   By: Kristine Garbe M.D.   On: 11/13/2018 03:41    Procedures Procedures (including critical care time)  Medications Ordered in ED Medications  HYDROmorphone (DILAUDID) injection 1 mg (1 mg Intramuscular Given 11/14/18 0012)  ketorolac (TORADOL) 30 MG/ML injection 60 mg (60 mg Intramuscular Given 11/14/18 0123)     Initial Impression / Assessment and Plan / ED Course  I have reviewed the  triage vital signs and the nursing notes.  Pertinent labs & imaging results that were available during my care of the patient were reviewed by me and considered in my medical decision making (see chart for details).        Patient to ED with continued right arm pain. Seen last night for same. Appropriately followed up with PCP today and is in process of referral to orthopedics.   She has been taking the medications provided last night (Percocet and Flexeril) without relief, and with escalating pain through today. She reports this is a similar pattern to her last episode months ago.   VSS. Exam reassuring. No neurologic deficits. Normal MRI cervical spine last night. Pain controlled in  ED with IM medications. She reports she is ready for discharge home.   Final Clinical Impressions(s) / ED Diagnoses   Final diagnoses:  None   1. Right arm pain  ED Discharge Orders    None       Elpidio AnisUpstill, Traquan Duarte, Cordelia Poche-C 11/14/18 [id number]    Zadie RhineWickline, Donald, MD 11/14/18 [phone]

## 2018-11-16 ENCOUNTER — Encounter (HOSPITAL_COMMUNITY): Payer: Self-pay

## 2018-11-16 ENCOUNTER — Emergency Department (HOSPITAL_COMMUNITY)
Admission: EM | Admit: 2018-11-16 | Discharge: 2018-11-16 | Disposition: A | Discharge status: Home / Self Care | Payer: Medicare Other | Attending: Emergency Medicine | Admitting: Emergency Medicine

## 2018-11-16 ENCOUNTER — Other Ambulatory Visit: Payer: Self-pay

## 2018-11-16 DIAGNOSIS — M25511 Pain in right shoulder: Secondary | ICD-10-CM | POA: Diagnosis not present

## 2018-11-16 DIAGNOSIS — Z7722 Contact with and (suspected) exposure to environmental tobacco smoke (acute) (chronic): Secondary | ICD-10-CM | POA: Insufficient documentation

## 2018-11-16 DIAGNOSIS — G8929 Other chronic pain: Secondary | ICD-10-CM | POA: Diagnosis not present

## 2018-11-16 MED ORDER — KETOROLAC TROMETHAMINE 60 MG/2ML IM SOLN
60.0000 mg | Freq: Once | INTRAMUSCULAR | Status: AC
  Administered 2018-11-16: 05:00:00 60 mg via INTRAMUSCULAR
  Filled 2018-11-16: qty 2

## 2018-11-16 MED ORDER — LIDOCAINE 5 % EX PTCH: 1.0000 | MEDICATED_PATCH | CUTANEOUS | 0 refills | Status: DC

## 2018-11-16 NOTE — ED Triage Notes (Signed)
Pt arrived with complaints of pain from her right shoulder down her arm. Pt reports having a history of bursitis and states it feels similar or concerned about a pinched nerve. She reports being seen twice this week at [facility] for the same and the pain medication prescribed is not helping.

## 2018-11-16 NOTE — ED Provider Notes (Signed)
[facility]-EMERGENCY DEPT Provider Note   CSN: [id number] Arrival date & time: 11/16/18  0218    History   Chief Complaint Chief Complaint  Patient presents with  . Shoulder Pain    HPI Natalie Buchanan is a 43 y.o. female who presents for evaluation of right shoulder pain.  Patient reports that this is been an ongoing issue and she has been seen in the ED previously for similar symptoms.  Most recently she was seen on 11/13/18.  Patient states she comes in today because she continues to have pain in the right shoulder that radiates to the right elbow.  She states that she has limited range of motion secondary to pain.  She was seen in the ED and prescribed Flexeril and hydrocodone, which she states she has been taking.  Patient states she has not been taking any other medications.  She denies any new trauma, injury, fall.  She has not noted any overlying warmth, erythema, edema.  She denies any numbness/weakness.       The history is provided by the patient.    Past Medical History:  Diagnosis Date  . Endometriosis   . Iron deficiency anemia 09/04/2018  . IUD (intrauterine device) in place     Patient Active Problem List   Diagnosis Date Noted  . Iron deficiency anemia 09/04/2018  . Endometriosis   . IUD (intrauterine device) in place     Past Surgical History:  Procedure Laterality Date  . CHOLECYSTECTOMY    . TUBAL LIGATION       OB History   No obstetric history on file.      Home Medications    Prior to Admission medications   Medication Sig Start Date End Date Taking? Authorizing Provider  buPROPion (WELLBUTRIN XL) 300 MG 24 hr tablet Take by mouth. 02/13/15 11/16/18 Yes [provider]  cyclobenzaprine (FLEXERIL) 10 MG tablet Take 1 tablet (10 mg total) by mouth 3 (three) times daily. 11/13/18  Yes Upstill, Shari, PA-C  doxepin (SINEQUAN) 25 MG capsule Take 25 mg by mouth at bedtime.   Yes [provider]  hydroxypropyl  methylcellulose / hypromellose (ISOPTO TEARS / GONIOVISC) 2.5 % ophthalmic solution Place 1 drop into both eyes 3 (three) times daily as needed for dry eyes.   Yes [provider]  oxyCODONE-acetaminophen (PERCOCET) 5-325 MG tablet Take 1 tablet by mouth every 4 (four) hours as needed for severe pain. 11/14/18  Yes Elpidio AnisUpstill, Shari, PA-C  Pediatric Multiple Vit-C-FA (PEDIATRIC MULTIVITAMIN) chewable tablet Chew 1 tablet by mouth daily.   Yes [provider]  polyethylene glycol (MIRALAX / GLYCOLAX) packet Take 17 g by mouth daily.   Yes [provider]  diclofenac (VOLTAREN) 75 MG EC tablet Take 1 tablet (75 mg total) by mouth 2 (two) times daily. Patient not taking: Reported on 11/16/2018 11/14/18   Elpidio AnisUpstill, Shari, PA-C  lidocaine (LIDODERM) 5 % Place 1 patch onto the skin daily. Remove & Discard patch within 12 hours or as directed by MD 11/16/18   Maxwell CaulLayden, Imojean Yoshino A, PA-C  predniSONE (STERAPRED UNI-PAK 21 TAB) 10 MG (21) TBPK tablet Take by mouth daily. Take 6 tabs by mouth daily  for 2 days, then 5 tabs for 2 days, then 4 tabs for 2 days, then 3 tabs for 2 days, 2 tabs for 2 days, then 1 tab by mouth daily for 2 days Patient not taking: Reported on 11/16/2018 02/16/18   Michela PitcherFawze, Mina A, PA-C  topiramate (TOPAMAX) 100  MG tablet Take by mouth. 07/31/16 01/12/18  [provider]    Family History Family History  Problem Relation Age of Onset  . Breast cancer Mother   . Breast cancer Sister   . Cancer - Cervical Sister   . Leukemia Neg Hx   . Lymphoma Neg Hx     Social History Social History   Tobacco Use  . Smoking status: Passive Smoke Exposure - Never Smoker  . Smokeless tobacco: Never Used  Substance Use Topics  . Alcohol use: Never    Frequency: Never  . Drug use: Never     Allergies   Duloxetine hcl and Linaclotide   Review of Systems Review of Systems  Constitutional: Negative for fever.  Musculoskeletal:       Right shoulder pain  Neurological:  Negative for weakness and numbness.  All other systems reviewed and are negative.    Physical Exam Updated Vital Signs BP (!) 150/101 (BP Location: Right Arm)   Pulse (!) 103   Temp 98.6 F (37 C) (Oral)   Resp 18   Ht 5\' 5"  (1.651 m)   Wt 127 kg   SpO2 99%   BMI 46.59 kg/m   Physical Exam Vitals signs and nursing note reviewed.  Constitutional:      Appearance: She is well-developed.  HENT:     Head: Normocephalic and atraumatic.  Eyes:     General: No scleral icterus.       Right eye: No discharge.        Left eye: No discharge.     Conjunctiva/sclera: Conjunctivae normal.  Neck:     Comments: Positive Spurling's maneuver.  Cardiovascular:     Pulses:          Radial pulses are 2+ on the right side and 2+ on the left side.  Pulmonary:     Effort: Pulmonary effort is normal.  Musculoskeletal:     Comments: Tenderness to palpation to right shoulder. No deformity or crepitus noted. No overlying warmth, erythema, edema. Limited ROM secondary to pain. Unable to assess Neer's, Hawkins, Lift off and empty can test. No bony tenderness noted to right elbow, right wrist. No tenderness to palpation to LUE. FROM of LUE. Negative Neers, Hawkins, Lift off.   Skin:    General: Skin is warm and dry.     Capillary Refill: Capillary refill takes less than 2 seconds.     Comments: Good distal cap refill. RUE is not dusky in appearance or cool to touch.  Neurological:     Mental Status: She is alert.  Psychiatric:        Speech: Speech normal.        Behavior: Behavior normal.      ED Treatments / Results  Labs (all labs ordered are listed, but only abnormal results are displayed) Labs Reviewed - No data to display  EKG None  Radiology No results found.  Procedures Procedures (including critical care time)  Medications Ordered in ED Medications  ketorolac (TORADOL) injection 60 mg (60 mg Intramuscular Given 11/16/18 0449)     Initial Impression / Assessment and Plan  / ED Course  I have reviewed the triage vital signs and the nursing notes.  Pertinent labs & imaging results that were available during my care of the patient were reviewed by me and considered in my medical decision making (see chart for details).        43 y.o. F who presents for evaluation of right shoulder pain.  This has been an ongoing issue and she has been seen in the ED. She has an ortho appointment scheduled. No trauma or injury. Patient is afebrile, non-toxic appearing, sitting comfortably on examination table. Vital signs reviewed and stable.  Patient is neurovascularly intact. History/physical exam is not concerning for septic arthritis, DVT of upper extremity, ischemic limb. Consider focal radiculopathy versus rotator cuff impingement versus musculoskeletal pain.  She has no new trauma, injury.  No indication for imaging.  Will give pain medication.  Additionally, patient requesting an arm sling.  Instructed patient to follow-up with orthopedics as previously directed. At this time, patient exhibits no emergent life-threatening condition that require further evaluation in ED or admission. Patient had ample opportunity for questions and discussion. All patient's questions were answered with full understanding. Strict return precautions discussed. Patient expresses understanding and agreement to plan.   Portions of this note were generated with Lobby[employer]. Dictation errors may occur despite best attempts at proofreading.   Final Clinical Impressions(s) / ED Diagnoses   Final diagnoses:  Chronic right shoulder pain    ED Discharge Orders         Ordered    lidocaine (LIDODERM) 5 %  Every 24 hours     11/16/18 0436           Volanda Napoleon, PA-C 11/16/18 0555    Palumbo, April, MD 11/16/18 6811

## 2018-11-16 NOTE — Discharge Instructions (Signed)
You can take Tylenol or Ibuprofen as directed for pain. You can alternate Tylenol and Ibuprofen every 4 hours. If you take Tylenol at 1pm, then you can take Ibuprofen at 5pm. Then you can take Tylenol again at 9pm.   Do not exceed 4000 mg of Tylenol a day.  Use Lidoderm patch as directed.  Follow-up with the orthopedic doctor as previously scheduled.  Return to the emergency department for any fevers, redness or swelling of shoulder, numbness/weakness or any other worsening concerning symptoms.

## 2018-12-02 ENCOUNTER — Ambulatory Visit: Payer: Medicare Other | Attending: Physician Assistant

## 2018-12-02 ENCOUNTER — Other Ambulatory Visit: Payer: Self-pay

## 2018-12-02 DIAGNOSIS — M25611 Stiffness of right shoulder, not elsewhere classified: Secondary | ICD-10-CM | POA: Insufficient documentation

## 2018-12-02 DIAGNOSIS — G8929 Other chronic pain: Secondary | ICD-10-CM | POA: Insufficient documentation

## 2018-12-02 DIAGNOSIS — M62838 Other muscle spasm: Secondary | ICD-10-CM | POA: Diagnosis present

## 2018-12-02 DIAGNOSIS — M25511 Pain in right shoulder: Secondary | ICD-10-CM | POA: Diagnosis present

## 2018-12-02 DIAGNOSIS — R293 Abnormal posture: Secondary | ICD-10-CM | POA: Diagnosis present

## 2018-12-02 NOTE — Therapy (Signed)
[facility] [address] [place], [place], [address] Phone: [phone]   Fax:  [phone]  Physical Therapy Evaluation  Patient Details  Name: Natalie BambergerLatisha Buchanan MRN: [id number] Date of Birth: 05/28/1975 Referring Provider (PT): Karoline Caldwellhomas Steele  [facility]   Encounter Date: 12/02/2018  PT End of Session - 12/02/18 1633    Visit Number  1    Number of Visits  12    Date for PT Re-Evaluation  01/09/19    Authorization Type  Medicare/MCD    PT Start Time  0430    PT Stop Time  0515    PT Time Calculation (min)  45 min    Activity Tolerance  Patient tolerated treatment well    Behavior During Therapy  [facility] for tasks assessed/performed       Past Medical History:  Diagnosis Date  . Endometriosis   . Iron deficiency anemia 09/04/2018  . IUD (intrauterine device) in place     Past Surgical History:  Procedure Laterality Date  . CHOLECYSTECTOMY    . TUBAL LIGATION      There were no vitals filed for this visit.   Subjective Assessment - 12/02/18 1641    Subjective  She report on and off shoulder pain for 15 years and more severe episodes in past 7-8 months.   MD thinks it's bursitis with OA.  MD suggested dry needling. Walks and stretching with yoga video. .    Limitations  --   depends on severity.   Diagnostic tests  Xray  shoulder LT and MRI  neck both negative    Currently in Pain?  Yes    Pain Score  4     Pain Location  Shoulder    Pain Orientation  Right    Pain Descriptors / Indicators  Throbbing    Pain Type  --   sub acute   Pain Radiating Towards  to RT elbow    Pain Onset  More than a month ago    Pain Frequency  Constant   never goes completely away   Aggravating Factors   Nothing specific    Pain Relieving Factors  meds,  pressure , heat.    Effect of Pain on Daily Activities  Depends on severity         [facility] - 12/02/18 0001      Assessment   Medical Diagnosis  RT shoulder pain     Referring  Provider (PT)  Karoline Caldwellhomas Steele  [facility]    Onset Date/Surgical Date  --   04/2018 on/off with last 15 years.    Hand Dominance  Right    Next MD Visit  As needed    Prior Therapy  PT  a long time ago.       Precautions   Precautions  None      Restrictions   Weight Bearing Restrictions  No      Balance Screen   Has the patient fallen in the past 6 months  No      Prior Function   Level of Independence  Independent   gets assist with flareups     Cognition   Overall Cognitive Status  Within Functional Limits for tasks assessed      Posture/Postural Control   Posture Comments  forward head and rounded shoulders      ROM / Strength   AROM / PROM / Strength  AROM;PROM;Strength      AROM   AROM Assessment  Site  Cervical;Shoulder    Right/Left Shoulder  Right;Left    Right Shoulder Flexion  120 Degrees    Right Shoulder ABduction  135 Degrees    Right Shoulder External Rotation  80 Degrees    Right Shoulder Horizontal  ADduction  100 Degrees    Left Shoulder Flexion  120 Degrees    Left Shoulder ABduction  140 Degrees    Left Shoulder External Rotation  80 Degrees    Left Shoulder Horizontal ADduction  110 Degrees    Cervical Flexion  40    Cervical Extension  30    Cervical - Right Side Bend  40    Cervical - Left Side Bend  35    Cervical - Right Rotation  65    Cervical - Left Rotation  73      Strength   Overall Strength Comments  Normal      Palpation   Palpation comment  Tedner on Rt parapsinals , levator and traps.       Special Tests    Special Tests  Cervical    Cervical Tests  Spurling's      Spurling's   Findings  Positive    Side  Right    Comment  To rt neck muscles to traps   feel on RT with LT side testing               Objective measurements completed on examination: See above findings.                PT Short Term Goals - 12/02/18 1726      PT SHORT TERM GOAL #1   Title  She will be independent with initial hEp    Time   3    Period  Weeks    Status  New      PT SHORT TERM GOAL #2   Title  She will report pain decreased 25% or more    Time  3    Period  Weeks    Status  New      PT SHORT TERM GOAL #3   Title  Active cervical rotation with incr to 70 degrees RT and LT    Time  3    Period  Weeks    Status  New        PT Long Term Goals - 12/02/18 1728      PT LONG TERM GOAL #1   Title  She will be indpendent with all hEp issued    Time  6    Period  Weeks    Status  New      PT LONG TERM GOAL #2   Title  she will report pain as 50% decr and intermittant    Time  6    Period  Weeks    Status  New      PT LONG TERM GOAL #3   Title  FOTO score will decrease to 40% limited  to demo percieved  improvement in function    Time  6    Period  Weeks    Status  New             Plan - 12/02/18 1717    Clinical Impression Statement  Ms Ritchie reports on and off pain in Rt or LT shoulder for years but reports incr severity ove past 7-8 months. nothing specifically incr pain and pain never resolves.  Earlier this year it was the [employer]  shoulder and now the RT. She has some limits with neck and shoulder ROM but her strength is normal. She had some mild increased RT neck and shoulder symptoms with cervical compression on the RT side but duid not extend past acromium. She has forward head posture that is mild    As there is no specific injury and she relates no specific things that increase pain. She should make some improvements with consistent HEP and sklled PT    Personal Factors and Comorbidities  Past/Current Experience;Time since onset of injury/illness/exacerbation    Examination-Activity Limitations  Reach Overhead;Carry;Caring for Others   requires assistance in home when severe   Examination-Participation Restrictions  Community Activity;Cleaning;Laundry    Stability/Clinical Decision Making  Unstable/Unpredictable    Clinical Decision Making  Moderate    Rehab Potential  Good    PT  Frequency  2x / week    PT Duration  6 weeks    PT Treatment/Interventions  Electrical Stimulation;Iontophoresis 4mg /ml Dexamethasone;Moist Heat;Traction;Ultrasound;Therapeutic exercise;Patient/family education;Manual techniques;Passive range of motion;Dry needling;Taping    PT Next Visit Plan  REveiw HEp and add as needed, Traction , ionto,  STW, modalities    PT Home Exercise Plan  Cervical and scapula retraction,   Neck stretch side bend and  levator,  Rhomboid stretch    Consulted and Agree with Plan of Care  Patient       Patient will benefit from skilled therapeutic intervention in order to improve the following deficits and impairments:  Obesity, Pain, Increased muscle spasms, Decreased range of motion, Decreased activity tolerance, Postural dysfunction, Impaired UE functional use  Visit Diagnosis: 1. Chronic right shoulder pain   2. Stiffness of right shoulder, not elsewhere classified   3. Other muscle spasm   4. Abnormal posture        Problem List Patient Active Problem List   Diagnosis Date Noted  . Iron deficiency anemia 09/04/2018  . Endometriosis   . IUD (intrauterine device) in place     Caprice RedChasse, Robynne Roat M  PT 12/02/2018, 5:33 PM  [facility] [address] [place], [place], [address] Phone: [phone]   Fax:  [phone]  Name: Natalie BambergerLatisha Buchanan MRN: [id number] Date of Birth: 06/24/1975

## 2018-12-02 NOTE — Patient Instructions (Signed)
Adduction (Passive)    Use other hand to hold elbow and bring arm across front toward opposite side. Hold 10-20____ seconds. Repeat __3__ times. Do ___3_ sessions per day.  Copyright  VHI. All rights reserved.  Adduction (Active)    Maintaining erect posture, draw shoulders back while bringing elbows back and inward. Repeat __10__ times. Do _3___ sessions per day.  Copyright  VHI. All rights reserved.  Levator Stretch    Grasp seat or sit on hand on side to be stretched. Turn head toward other side and look down. Use hand on head to gently stretch neck in that position. Hold ____ seconds. Repeat on other side. Repeat _2-3 times  RT/Lt___ times. Do _3___ sessions per day.  http://gt2.exer.us/31   Copyright  VHI. All rights reserved.  Side Bend, Sitting    Sit, head in comfortable, centered position, chin slightly tucked. Gently tilt head, bringing ear toward same-side shoulder. Hold 10___ seconds.  Repeat _2-3__ times per session. Do _3__ sessions per day.  Copyright  VHI. All rights reserved.  Flexibility: Neck Retraction    Pull head straight back, keeping eyes and jaw level. Repeat __2-3 __ times per set. Do ____ sets per session. Do _5___ sessions per day.  http://orth.exer.us/345   Copyright  VHI. All rights reserved.

## 2018-12-09 ENCOUNTER — Ambulatory Visit: Payer: Medicare Other | Admitting: Physical Therapy

## 2018-12-09 ENCOUNTER — Encounter: Payer: Self-pay | Admitting: Physical Therapy

## 2018-12-09 ENCOUNTER — Other Ambulatory Visit: Payer: Self-pay

## 2018-12-09 DIAGNOSIS — M62838 Other muscle spasm: Secondary | ICD-10-CM

## 2018-12-09 DIAGNOSIS — R293 Abnormal posture: Secondary | ICD-10-CM

## 2018-12-09 DIAGNOSIS — M25611 Stiffness of right shoulder, not elsewhere classified: Secondary | ICD-10-CM

## 2018-12-09 DIAGNOSIS — G8929 Other chronic pain: Secondary | ICD-10-CM

## 2018-12-09 DIAGNOSIS — M25511 Pain in right shoulder: Secondary | ICD-10-CM | POA: Diagnosis not present

## 2018-12-09 NOTE — Therapy (Signed)
[place], [place], [address] Phone: [phone]   Fax:  [phone]  Physical Therapy Treatment  Patient Details  Name: Natalie Buchanan MRN: [id number] Date of Birth: 18-Feb-1976 Referring Provider (PT): Annye Asa  [facility]   Encounter Date: 12/09/2018  PT End of Session - 12/09/18 1530    Visit Number  2    Number of Visits  12    Date for PT Re-Evaluation  01/09/19    Authorization Type  Medicare/MCD    PT Start Time  9983    PT Stop Time  1624    PT Time Calculation (min)  51 min    Activity Tolerance  Patient tolerated treatment well       Past Medical History:  Diagnosis Date  . Endometriosis   . Iron deficiency anemia 09/04/2018  . IUD (intrauterine device) in place     Past Surgical History:  Procedure Laterality Date  . CHOLECYSTECTOMY    . TUBAL LIGATION      There were no vitals filed for this visit.  Subjective Assessment - 12/09/18 1533    Subjective  Pt reports that the exercises seem to be helping    Currently in Pain?  Yes    Pain Score  3     Pain Location  Shoulder    Pain Orientation  Right    Pain Descriptors / Indicators  Tightness    Pain Type  Chronic pain    Pain Radiating Towards  5/10 Lt upper trap area    Pain Onset  More than a month ago    Pain Frequency  Constant    Aggravating Factors   overuse and regular use    Pain Relieving Factors  slow motion/stretching and heat.                       [place] Adult PT Treatment/Exercise - 12/09/18 0001      Exercises   Exercises  Shoulder      Shoulder Exercises: Supine   Other Supine Exercises  head presses and shoulder presses      Shoulder Exercises: Seated   Other Seated Exercises  scapular and cervical retractions, BWD shoulder rolls      Modalities   Modalities  Moist Heat      Moist Heat Therapy   Number Minutes Moist Heat  12 Minutes    Moist Heat Location  Cervical      Manual Therapy    Manual Therapy  Soft tissue mobilization    Manual therapy comments  skilled palpation and monitoring during DN    Soft tissue mobilization  STM to bilat upper traps/levators, cervical paraspinals and occipital muscles.        Trigger Point Dry Needling - 12/09/18 0001    Consent Given?  Yes    Education Handout Provided  Yes    Muscles Treated Head and Neck  Upper trapezius;Levator scapulae;Cervical multifidi    Upper Trapezius Response  Palpable increased muscle length;Twitch reponse elicited   LT/RT   Levator Scapulae Response  Twitch response elicited;Palpable increased muscle length   Lt/RT   Cervical multifidi Response  Palpable increased muscle length;Twitch reponse elicited   LT/RT          PT Education - 12/09/18 1540    Education Details  DN    Person(s) Educated  Patient    Methods  Explanation;Handout    Comprehension  Returned demonstration;Verbalized understanding  PT Short Term Goals - 12/02/18 1726      PT SHORT TERM GOAL #1   Title  She will be independent with initial hEp    Time  3    Period  Weeks    Status  New      PT SHORT TERM GOAL #2   Title  She will report pain decreased 25% or more    Time  3    Period  Weeks    Status  New      PT SHORT TERM GOAL #3   Title  Active cervical rotation with incr to 70 degrees RT and LT    Time  3    Period  Weeks    Status  New        PT Long Term Goals - 12/02/18 1728      PT LONG TERM GOAL #1   Title  She will be indpendent with all hEp issued    Time  6    Period  Weeks    Status  New      PT LONG TERM GOAL #2   Title  she will report pain as 50% decr and intermittant    Time  6    Period  Weeks    Status  New      PT LONG TERM GOAL #3   Title  FOTO score will decrease to 40% limited  to demo percieved  improvement in function    Time  6    Period  Weeks    Status  New            Plan - 12/09/18 1617    Clinical Impression Statement  Natalie Buchanan tolerated DN and manual  work to bilat upper shoulders and neck well.  She had improved cervical rotation at end of session and reported feelings of decreased pulling/pain/tightness and ease of movement in her neck and shoulders.  This is her first visit after her initial eval and is doing well with her HEP.  She will need postural correction exercise along with scapular/cervical and RTC stabilization exercises.    Rehab Potential  Good    PT Frequency  2x / week    PT Duration  6 weeks    PT Treatment/Interventions  Electrical Stimulation;Iontophoresis 4mg /ml Dexamethasone;Moist Heat;Traction;Ultrasound;Therapeutic exercise;Patient/family education;Manual techniques;Passive range of motion;Dry needling;Taping    PT Next Visit Plan  assess response to DN/manual work, possible ionto if shoulder bursitis flares up, scapular and postural exercise.    Consulted and Agree with Plan of Care  Patient       Patient will benefit from skilled therapeutic intervention in order to improve the following deficits and impairments:  Obesity, Pain, Increased muscle spasms, Decreased range of motion, Decreased activity tolerance, Postural dysfunction, Impaired UE functional use  Visit Diagnosis: 1. Chronic right shoulder pain   2. Stiffness of right shoulder, not elsewhere classified   3. Other muscle spasm   4. Abnormal posture        Problem List Patient Active Problem List   Diagnosis Date Noted  . Iron deficiency anemia 09/04/2018  . Endometriosis   . IUD (intrauterine device) in place     Roderic ScarceSusan Zaydon Kinser PT  12/09/2018, 4:20 PM  [facility] [address] [place], [place], [address] Phone: [phone]   Fax:  [phone]  Name: Natalie BambergerLatisha Buchanan MRN: [id number] Date of Birth: 06/10/1975

## 2018-12-11 ENCOUNTER — Encounter: Payer: Self-pay | Admitting: Physical Therapy

## 2018-12-11 ENCOUNTER — Ambulatory Visit: Payer: Medicare Other | Admitting: Physical Therapy

## 2018-12-11 ENCOUNTER — Other Ambulatory Visit: Payer: Self-pay

## 2018-12-11 DIAGNOSIS — G8929 Other chronic pain: Secondary | ICD-10-CM

## 2018-12-11 DIAGNOSIS — M25611 Stiffness of right shoulder, not elsewhere classified: Secondary | ICD-10-CM

## 2018-12-11 DIAGNOSIS — R293 Abnormal posture: Secondary | ICD-10-CM

## 2018-12-11 DIAGNOSIS — M62838 Other muscle spasm: Secondary | ICD-10-CM

## 2018-12-11 DIAGNOSIS — M25511 Pain in right shoulder: Secondary | ICD-10-CM | POA: Diagnosis not present

## 2018-12-11 NOTE — Therapy (Signed)
[place], [place], [address] Phone: [phone]   Fax:  [phone]  Physical Therapy Treatment  Patient Details  Name: Natalie Buchanan MRN: [id number] Date of Birth: 1976-04-25 Referring Provider (PT): Annye Asa  [facility]   Encounter Date: 12/11/2018  PT End of Session - 12/11/18 1542    Visit Number  3    Number of Visits  12    Date for PT Re-Evaluation  01/09/19    Authorization Type  Medicare/MCD    PT Start Time  8182    PT Stop Time  1614    PT Time Calculation (min)  41 min    Activity Tolerance  Patient tolerated treatment well    Behavior During Therapy  [facility] for tasks assessed/performed       Past Medical History:  Diagnosis Date  . Endometriosis   . Iron deficiency anemia 09/04/2018  . IUD (intrauterine device) in place     Past Surgical History:  Procedure Laterality Date  . CHOLECYSTECTOMY    . TUBAL LIGATION      There were no vitals filed for this visit.  Subjective Assessment - 12/11/18 1537    Subjective  " The DN really helped, still some aching in the neck"    Diagnostic tests  Xray  shoulder LT and MRI  neck both negative    Currently in Pain?  Yes    Pain Score  2          [facility] PT Assessment - 12/11/18 0001      Assessment   Medical Diagnosis  RT shoulder pain     Referring Provider (PT)  Annye Asa  [facility]    Hand Dominance  Right    Next MD Visit  As needed    Prior Therapy  PT  a long time ago.                    [place] Adult PT Treatment/Exercise - 12/11/18 0001      Shoulder Exercises: Seated   Other Seated Exercises  seated thoracic rotation 2 x 10 holding end range x 2 seconds, thoracic extension with arms crossed 1 x 10 hdld 2 sec      Shoulder Exercises: Stretch   Other Shoulder Stretches  upper trap stretch 2 x 30       Manual Therapy   Manual Therapy  Soft tissue mobilization;Joint mobilization;Other (comment)    Manual therapy  comments  skilled palpation and monitoring during DN    Joint Mobilization  C3-C7, T1-T8 grade III, bil first rib grade     Soft tissue mobilization  IASTM to bilat upper traps/levators, cervical paraspinals and occipital muscles.     Other Manual Therapy   Rshoulder inhibition taping    Kinesiotex  --       Trigger Point Dry Needling - 12/11/18 0001    Consent Given?  Yes    Education Handout Provided  Previously provided    Upper Trapezius Response  Palpable increased muscle length;Twitch reponse elicited    Levator Scapulae Response  Twitch response elicited;Palpable increased muscle length    Cervical multifidi Response  Palpable increased muscle length;Twitch reponse elicited   X9-B7 bil            PT Short Term Goals - 12/02/18 1726      PT SHORT TERM GOAL #1   Title  She will be independent with initial hEp    Time  3    Period  Weeks    Status  New      PT SHORT TERM GOAL #2   Title  She will report pain decreased 25% or more    Time  3    Period  Weeks    Status  New      PT SHORT TERM GOAL #3   Title  Active cervical rotation with incr to 70 degrees RT and LT    Time  3    Period  Weeks    Status  New        PT Long Term Goals - 12/02/18 1728      PT LONG TERM GOAL #1   Title  She will be indpendent with all hEp issued    Time  6    Period  Weeks    Status  New      PT LONG TERM GOAL #2   Title  she will report pain as 50% decr and intermittant    Time  6    Period  Weeks    Status  New      PT LONG TERM GOAL #3   Title  FOTO score will decrease to 40% limited  to demo percieved  improvement in function    Time  6    Period  Weeks    Status  New            Plan - 12/11/18 1617    Clinical Impression Statement  Natalie Buchanan notes improvement in neck/ shoulder pain following last session with DN. continued working on posterior cervical structures with DN and IASTM techniques. She responded well to thoracic mobility exercises and noted  improvement in bil upper trap tension with inhibition taping.    PT Treatment/Interventions  Electrical Stimulation;Iontophoresis 4mg /ml Dexamethasone;Moist Heat;Traction;Ultrasound;Therapeutic exercise;Patient/family education;Manual techniques;Passive range of motion;Dry needling;Taping    PT Next Visit Plan  assess response to DN/manual work, possible ionto if shoulder bursitis flares up, scapular and postural exercise.    PT Home Exercise Plan  Cervical and scapula retraction,   Neck stretch side bend and  levator,  Rhomboid stretch    Consulted and Agree with Plan of Care  Patient       Patient will benefit from skilled therapeutic intervention in order to improve the following deficits and impairments:  Obesity, Pain, Increased muscle spasms, Decreased range of motion, Decreased activity tolerance, Postural dysfunction, Impaired UE functional use  Visit Diagnosis: 1. Chronic right shoulder pain   2. Stiffness of right shoulder, not elsewhere classified   3. Other muscle spasm   4. Abnormal posture        Problem List Patient Active Problem List   Diagnosis Date Noted  . Iron deficiency anemia 09/04/2018  . Endometriosis   . IUD (intrauterine device) in place    ClarenceKristoffer Leamon PT, DPT, LAT, ATC  12/11/18  4:22 PM      [facility] [address] [place], [place], [address] Phone: [phone]   Fax:  [phone]  Name: Natalie Buchanan MRN: [id number] Date of Birth: 05/28/1975

## 2018-12-16 ENCOUNTER — Other Ambulatory Visit: Payer: Self-pay

## 2018-12-16 ENCOUNTER — Encounter: Payer: Self-pay | Admitting: Physical Therapy

## 2018-12-16 ENCOUNTER — Ambulatory Visit: Payer: Medicare Other | Attending: Physician Assistant | Admitting: Physical Therapy

## 2018-12-16 DIAGNOSIS — R293 Abnormal posture: Secondary | ICD-10-CM | POA: Diagnosis present

## 2018-12-16 DIAGNOSIS — M25511 Pain in right shoulder: Secondary | ICD-10-CM | POA: Insufficient documentation

## 2018-12-16 DIAGNOSIS — M25611 Stiffness of right shoulder, not elsewhere classified: Secondary | ICD-10-CM | POA: Insufficient documentation

## 2018-12-16 DIAGNOSIS — G8929 Other chronic pain: Secondary | ICD-10-CM

## 2018-12-16 DIAGNOSIS — M62838 Other muscle spasm: Secondary | ICD-10-CM | POA: Diagnosis present

## 2018-12-16 NOTE — Therapy (Signed)
[facility] [address] [place], [place], [address] Phone: [phone]   Fax:  [phone]  Physical Therapy Treatment  Patient Details  Name: Natalie BambergerLatisha Buchanan MRN: [id number] Date of Birth: 10/11/1975 Referring Provider (PT): Karoline Caldwellhomas Steele  [facility]   Encounter Date: 12/16/2018  PT End of Session - 12/16/18 1421    Visit Number  4    Number of Visits  12    Date for PT Re-Evaluation  01/09/19    PT Start Time  1417    PT Stop Time  1501    PT Time Calculation (min)  44 min    Activity Tolerance  Patient tolerated treatment well    Behavior During Therapy  [facility] for tasks assessed/performed       Past Medical History:  Diagnosis Date  . Endometriosis   . Iron deficiency anemia 09/04/2018  . IUD (intrauterine device) in place     Past Surgical History:  Procedure Laterality Date  . CHOLECYSTECTOMY    . TUBAL LIGATION      There were no vitals filed for this visit.  Subjective Assessment - 12/16/18 1422    Subjective  " The DN helped and the tape made it feel better until I had to take it off"    Currently in Pain?  Yes    Pain Score  4     Pain Orientation  Right         [facility] PT Assessment - 12/16/18 0001      Assessment   Medical Diagnosis  RT shoulder pain     Referring Provider (PT)  Karoline Caldwellhomas Steele  [facility]                   [facility]Ex[facility] - 12/16/18 0001      Self-Care   Self-Care  Other Self-Care Comments    Other Self-Care Comments   MTPR techniques and where she can find tools to assist with it      Neck Exercises: Supine   Capital Flexion  10 reps;10 secs   with chin tuck     Lumbar Exercises: Supine   Dead Bug  5 reps   10 sec hold     Shoulder Exercises: Supine   Protraction  20 reps;Both;Strengthening    Other Supine Exercises  thoracic extension over boslter 1 x 10 with 3 sec hold   with hands behind the head and elbows adducted   Other Supine Exercises  lower trap  strengthening with red theraband and shoulder flexion      Shoulder Exercises: ROM/Strengthening   UBE (Upper Arm Bike)  L1 x 4 min    changing direction at 2 min     Shoulder Exercises: Stretch   Other Shoulder Stretches  bil rhomboid 2 x 30 sec    Other Shoulder Stretches  upper trap stretch 2 x 30       Manual Therapy   Other Manual Therapy   R shoulder inhibition taping             PT Education - 12/16/18 1501    Education Details  updated HEP for neck core and shoulder strengthening    Person(s) Educated  Patient    Methods  Explanation    Comprehension  Verbalized understanding       PT Short Term Goals - 12/02/18 1726      PT SHORT TERM GOAL #1   Title  She will be independent with initial hEp  Time  3    Period  Weeks    Status  New      PT SHORT TERM GOAL #2   Title  She will report pain decreased 25% or more    Time  3    Period  Weeks    Status  New      PT SHORT TERM GOAL #3   Title  Active cervical rotation with incr to 70 degrees RT and LT    Time  3    Period  Weeks    Status  New        PT Long Term Goals - 12/02/18 1728      PT LONG TERM GOAL #1   Title  She will be indpendent with all hEp issued    Time  6    Period  Weeks    Status  New      PT LONG TERM GOAL #2   Title  she will report pain as 50% decr and intermittant    Time  6    Period  Weeks    Status  New      PT LONG TERM GOAL #3   Title  FOTO score will decrease to 40% limited  to demo percieved  improvement in function    Time  6    Period  Weeks    Status  New            Plan - 12/16/18 1501    Clinical Impression Statement  focused session on MTPR techniques to promote muslce relief at home. continued working on core and shoulder strengthening to maximize stabuility, She did exericses well but did not some mild soreness in the R shoulder end of session.    PT Treatment/Interventions  Electrical Stimulation;Iontophoresis 4mg /ml Dexamethasone;Moist  Heat;Traction;Ultrasound;Therapeutic exercise;Patient/family education;Manual techniques;Passive range of motion;Dry needling;Taping    PT Next Visit Plan  assess response to DN/manual work, possible ionto if shoulder bursitis flares up, scapular and postural exercise.    PT Home Exercise Plan  Cervical and scapula retraction,   Neck stretch side bend and  levator,  Rhomboid stretch, thoracic extension, chin tuck head lift, dead bug, ceiling punches    Consulted and Agree with Plan of Care  Patient       Patient will benefit from skilled therapeutic intervention in order to improve the following deficits and impairments:     Visit Diagnosis: 1. Chronic right shoulder pain   2. Stiffness of right shoulder, not elsewhere classified   3. Other muscle spasm   4. Abnormal posture        Problem List Patient Active Problem List   Diagnosis Date Noted  . Iron deficiency anemia 09/04/2018  . Endometriosis   . IUD (intrauterine device) in place     [place] PT, DPT, LAT, ATC  12/16/18  3:12 PM      [facility] [address] [place], [place], [address] Phone: [phone]   Fax:  [phone]  Name: Natalie Buchanan MRN: [id number] Date of Birth: 01-12-1976

## 2018-12-18 ENCOUNTER — Ambulatory Visit: Payer: Medicare Other | Admitting: Physical Therapy

## 2018-12-19 ENCOUNTER — Ambulatory Visit: Payer: Medicare Other | Admitting: Physical Therapy

## 2018-12-23 ENCOUNTER — Ambulatory Visit: Payer: Medicare Other | Admitting: Physical Therapy

## 2018-12-25 ENCOUNTER — Encounter: Payer: Medicare Other | Admitting: Physical Therapy

## 2018-12-26 ENCOUNTER — Ambulatory Visit: Payer: Medicare Other | Admitting: Physical Therapy

## 2018-12-29 ENCOUNTER — Ambulatory Visit: Payer: Medicare Other | Admitting: Physical Therapy

## 2018-12-29 ENCOUNTER — Other Ambulatory Visit: Payer: Self-pay

## 2018-12-29 ENCOUNTER — Encounter: Payer: Self-pay | Admitting: Physical Therapy

## 2018-12-29 DIAGNOSIS — M25511 Pain in right shoulder: Secondary | ICD-10-CM | POA: Diagnosis not present

## 2018-12-29 DIAGNOSIS — M62838 Other muscle spasm: Secondary | ICD-10-CM

## 2018-12-29 DIAGNOSIS — G8929 Other chronic pain: Secondary | ICD-10-CM

## 2018-12-29 DIAGNOSIS — R293 Abnormal posture: Secondary | ICD-10-CM

## 2018-12-29 DIAGNOSIS — M25611 Stiffness of right shoulder, not elsewhere classified: Secondary | ICD-10-CM

## 2018-12-29 NOTE — Therapy (Addendum)
[place], [place], [address] Phone: [phone]   Fax:  [phone]  Physical Therapy Treatment/Discharge Patient Details  Name: Natalie Buchanan MRN: [id number] Date of Birth: July 24, 1975 Referring Provider (PT): Annye Asa  [facility]   Encounter Date: 12/29/2018  PT End of Session - 12/29/18 1422    Visit Number  5    Number of Visits  12    Date for PT Re-Evaluation  01/09/19    Authorization Type  Medicare/MCD    PT Start Time  6962    PT Stop Time  1500    PT Time Calculation (min)  38 min    Activity Tolerance  Patient tolerated treatment well    Behavior During Therapy  [facility] for tasks assessed/performed       Past Medical History:  Diagnosis Date  . Endometriosis   . Iron deficiency anemia 09/04/2018  . IUD (intrauterine device) in place     Past Surgical History:  Procedure Laterality Date  . CHOLECYSTECTOMY    . TUBAL LIGATION      There were no vitals filed for this visit.  Subjective Assessment - 12/29/18 1423    Subjective  Pt reports she has about 2/10 pain when taking some medication. Thinks she is 40-50% improved    Currently in Pain?  Yes    Pain Score  2     Pain Location  Shoulder    Pain Orientation  Right    Pain Descriptors / Indicators  Aching;Dull    Pain Type  Chronic pain    Pain Onset  More than a month ago    Pain Frequency  Intermittent    Aggravating Factors   overuse    Pain Relieving Factors  heat exercise and medication         [facility] PT Assessment - 12/29/18 0001      Assessment   Medical Diagnosis  RT shoulder pain     Referring Provider (PT)  Annye Asa  [facility]      AROM   Cervical - Right Rotation  78    Cervical - Left Rotation  83                   [facility] Adult PT Treatment/Exercise - 12/29/18 0001      Shoulder Exercises: Standing   External Rotation  Strengthening;Right;Theraband   30 reps with towel under elbow   Theraband Level  (Shoulder External Rotation)  Level 2 (Red)    Internal Rotation  Strengthening;Right;Theraband   30 reps with towel under elbow   Other Standing Exercises  3x10 shoulder flex with serratus resistance.       Shoulder Exercises: Pulleys   Flexion  2 minutes    Scaption  2 minutes      Shoulder Exercises: ROM/Strengthening   UBE (Upper Arm Bike)  L1 x 4 min       Shoulder Exercises: Isometric Strengthening   Other Isometric Exercises  quadruped, single arm reach 10x5sec each side.       Shoulder Exercises: Stretch   Other Shoulder Stretches  sets of 10 bridges, shoulders with red band flexion, horizontal abduction               PT Short Term Goals - 12/29/18 1455      PT SHORT TERM GOAL #1   Title  She will be independent with initial hEp    Status  Achieved      PT  SHORT TERM GOAL #2   Title  She will report pain decreased 25% or more    Status  Achieved      PT SHORT TERM GOAL #3   Title  Active cervical rotation with incr to 70 degrees RT and LT    Baseline  rt 78, Lt 83    Status  Achieved        PT Long Term Goals - 12/29/18 1457      PT LONG TERM GOAL #1   Title  She will be indpendent with all hEp issued    Status  On-going      PT LONG TERM GOAL #2   Title  she will report pain as 50% decr and intermittant      PT LONG TERM GOAL #3   Title  FOTO score will decrease to 40% limited  to demo percieved  improvement in function    Status  On-going            Plan - 12/29/18 1458    Clinical Impression Statement  Natalie Buchanan is making great progress in regards to her Rt shoulder pain and function.  Meeting almost half of her goals.  She is responding great to her new HEP.  She has a flare up of back and Lt hip pain and has requested a referral for these from her MD.  She will need an assessment of those once received.    Rehab Potential  Good    PT Frequency  2x / week    PT Duration  6 weeks    PT Treatment/Interventions  Electrical  Stimulation;Iontophoresis 29m/ml Dexamethasone;Moist Heat;Traction;Ultrasound;Therapeutic exercise;Patient/family education;Manual techniques;Passive range of motion;Dry needling;Taping    PT Next Visit Plan  cont with RTC and scapular strengthening, assess back and Lt hip if order is here.    Consulted and Agree with Plan of Care  Patient       Patient will benefit from skilled therapeutic intervention in order to improve the following deficits and impairments:  Obesity, Pain, Increased muscle spasms, Decreased range of motion, Decreased activity tolerance, Postural dysfunction, Impaired UE functional use  Visit Diagnosis: 1. Chronic right shoulder pain   2. Stiffness of right shoulder, not elsewhere classified   3. Other muscle spasm   4. Abnormal posture        Problem List Patient Active Problem List   Diagnosis Date Noted  . Iron deficiency anemia 09/04/2018  . Endometriosis   . IUD (intrauterine device) in place     SJeral PinchPT  12/29/2018, 3:07 PM  [facility] [address]Phone: [phone]  Fax:  [phone] Name: Natalie GoschMRN: [id number] of Birth: [birth year] PHYSICAL THERAPY DISCHARGE SUMMARY  Visits from Start of Care: 5 Current functional level related to goals / functional outcomes: Unknown as she canceled her remaining appointments due to work conflicts   Remaining deficits: Unknown  Education / Equipment: HEP Plan:                                                    Patient goals were partially met. Patient is being discharged due to not returning since the last visit.  ?????   SPearson ForsterPT  02/10/11

## 2018-12-30 ENCOUNTER — Encounter: Payer: Medicare Other | Admitting: Physical Therapy

## 2019-01-01 ENCOUNTER — Encounter: Payer: Medicare Other | Admitting: Physical Therapy

## 2019-01-02 ENCOUNTER — Encounter: Payer: Medicare Other | Admitting: Physical Therapy

## 2019-01-05 ENCOUNTER — Encounter: Payer: Medicare Other | Admitting: Physical Therapy

## 2019-01-09 ENCOUNTER — Encounter: Payer: Medicare Other | Admitting: Physical Therapy

## 2019-04-30 ENCOUNTER — Telehealth: Payer: Self-pay | Admitting: Hematology and Oncology

## 2019-04-30 NOTE — Telephone Encounter (Signed)
[facility] transfer to [place]. Confirmed January appointment with patient.  °

## 2019-05-19 ENCOUNTER — Other Ambulatory Visit: Payer: Self-pay | Admitting: Hematology and Oncology

## 2019-05-19 DIAGNOSIS — D500 Iron deficiency anemia secondary to blood loss (chronic): Secondary | ICD-10-CM

## 2019-05-19 DIAGNOSIS — D72829 Elevated white blood cell count, unspecified: Secondary | ICD-10-CM

## 2019-05-19 NOTE — Progress Notes (Signed)
[place] [phone]   [phone]  PROGRESS NOTE  Patient Care Team: Natalie Friendly, MD as PCP - General (Internal Medicine)  Hematological/Oncological History #Leukocytosis, Neutrophilic Predominance 1) 12/20/8278: WBC 8 HB 12.7 plts 307,000 2) 07/24/2017: WBC 10 HB 12.4 3) 05/15/2018: WBC 15 HB 11.2 pts 365,000. 4) 07/02/2018: WBC 11.6 HB 11.6 plts 398,000 5) 11/12/2018: WBC 11 HB 13.2 plts 354,000  #Iron Deficiency Anemia 1) 08/26/2018: WBC 12.0, Hgb 11.6, Plt 360. Iron 33, TIBC 279, Sat 12%, Ferritin 25 2) 09/11/2018 and 09/18/2018: treated with Feraheme 568m IV 3) 11/12/2018: WBC 11 HB 13.2 plts 354,000  Interval History:  Natalie Buchanan with medical history significant for leukocytosis and iron deficiency anemia presents for a follow up visit. She was last seen by Natalie Buchanan 11/12/2018.   In the interim since her last visit [name] been at her baseline level of health.  She reports that she has periodic bursitis flareups which occasionally require ED visits and steroid therapy.  She most recently has been started on hydroxychloroquine twice daily 200 mg.  She reports that her symptoms are worse in her right hand but also occur in her shoulders and arms.  Generally begins as a shoulder ache and then proceeds to other joints.  She notes that these flares occur every 3 to 4 months with the last one occurring in December 2020.  During that time is also experienced weight gain.  On further discussion she reports that she used to have a history of endometriosis and passed large clots during her period.  She has undergone evaluation and treatment with OB/GYN most recently with the placement of an IUD 2 years ago.  Since that time she has had lighter menstrual cycles occurring every 28 days and lasting for about 3 days.  She reports that the pads she uses her about 5 to 6/day.  She had heavy menstrual cycles for approximately 10 years before it  was brought under control.  Otherwise the patient notes that she does have constipation but denies any dark stools, bruising, or other overt signs of bleeding.  She does currently take multivitamins which contain iron.  She reports that her intolerance to iron came from taking liquid iron reporting that she had nausea and regurgitation with this.  She currently eats relatively limited red meat and mostly eats chicken and fish.  Of note she is a non-smoker.  A full 10 point ROS is listed below.   MEDICAL HISTORY:  Past Medical History:  Diagnosis Date   Endometriosis    Iron deficiency anemia 09/04/2018   IUD (intrauterine device) in place     SURGICAL HISTORY: Past Surgical History:  Procedure Laterality Date   CHOLECYSTECTOMY     TUBAL LIGATION      SOCIAL HISTORY: Social History   Socioeconomic History   Marital status: Single    Spouse name: Not on file   Number of children: Not on file   Years of education: Not on file   Highest education level: Not on file  Occupational History   Not on file  Tobacco Use   Smoking status: Passive Smoke Exposure - Never Smoker   Smokeless tobacco: Never Used  Substance and Sexual Activity   Alcohol use: Never   Drug use: Never   Sexual activity: Not on file  Other Topics Concern   Not on file  Social History Narrative   Not on file   Social Determinants of Health  Financial Resource Strain:    Difficulty of Paying Living Expenses: Not on file  Food Insecurity:    Worried About Charity fundraiser in the Last Year: Not on file   [employer] of Food in the Last Year: Not on file  Transportation Needs:    Lack of Transportation (Medical): Not on file   Lack of Transportation (Non-Medical): Not on file  Physical Activity:    Days of Exercise per Week: Not on file   Minutes of Exercise per Session: Not on file  Stress:    Feeling of Stress : Not on file  Social Connections:    Frequency of Communication  with Friends and Family: Not on file   Frequency of Social Gatherings with Friends and Family: Not on file   Attends Religious Services: Not on file   Active Member of Clubs or Organizations: Not on file   Attends Archivist Meetings: Not on file   Marital Status: Not on file  Intimate Partner Violence:    Fear of Current or Ex-Partner: Not on file   Emotionally Abused: Not on file   Physically Abused: Not on file   Sexually Abused: Not on file    FAMILY HISTORY: Family History  Problem Relation Age of Onset   Breast cancer Mother    Breast cancer Sister    Cancer - Cervical Sister    Leukemia Neg Hx    Lymphoma Neg Hx     ALLERGIES:  is allergic to duloxetine hcl and linaclotide.  MEDICATIONS:  Current Outpatient Medications  Medication Sig Dispense Refill   HYDROcodone-acetaminophen (NORCO/VICODIN) 5-325 MG tablet Take 1 tablet by mouth every 6 (six) hours as needed for moderate pain.     hydroxychloroquine (PLAQUENIL) 200 MG tablet Take by mouth 2 (two) times daily.     topiramate (TOPAMAX) 100 MG tablet Take 100 mg by mouth 2 (two) times daily.     buPROPion (WELLBUTRIN XL) 300 MG 24 hr tablet Take by mouth.     cyclobenzaprine (FLEXERIL) 10 MG tablet Take 1 tablet (10 mg total) by mouth 3 (three) times daily. 10 tablet 0   doxepin (SINEQUAN) 25 MG capsule Take 25 mg by mouth at bedtime.     hydroxypropyl methylcellulose / hypromellose (ISOPTO TEARS / GONIOVISC) 2.5 % ophthalmic solution Place 1 drop into both eyes 3 (three) times daily as needed for dry eyes.     Pediatric Multiple Vit-C-FA (PEDIATRIC MULTIVITAMIN) chewable tablet Chew 1 tablet by mouth daily.     polyethylene glycol (MIRALAX / GLYCOLAX) packet Take 17 g by mouth daily.     No current facility-administered medications for this visit.    REVIEW OF SYSTEMS:   Constitutional: ( - ) fevers, ( - )  chills , ( - ) night sweats Eyes: ( - ) blurriness of vision, ( - ) double  vision, ( - ) watery eyes Ears, nose, mouth, throat, and face: ( - ) mucositis, ( - ) sore throat Respiratory: ( - ) cough, ( - ) dyspnea, ( - ) wheezes Cardiovascular: ( - ) palpitation, ( - ) chest discomfort, ( - ) lower extremity swelling Gastrointestinal:  ( - ) nausea, ( - ) heartburn, ( - ) change in bowel habits Skin: ( - ) abnormal skin rashes Lymphatics: ( - ) new lymphadenopathy, ( - ) easy bruising Neurological: ( - ) numbness, ( - ) tingling, ( - ) new weaknesses Behavioral/Psych: ( - ) mood change, ( - )  new changes  All other systems were reviewed with the patient and are negative.  PHYSICAL EXAMINATION: ECOG PERFORMANCE STATUS: 1 - Symptomatic but completely ambulatory  Vitals:   05/20/19 1152  BP: 113/61  Pulse: (!) 103  Resp: 17  Temp: (!) 97.3 F (36.3 C)  SpO2: 100%   Filed Weights   05/20/19 1152  Weight: 288 lb 11.2 oz (131 kg)    GENERAL: well appearing middle aged [place] Buchanan. alert, no distress and comfortable SKIN: skin color, texture, turgor are normal, no rashes or significant lesions EYES: conjunctiva are pink and non-injected, sclera clear LYMPH:  no palpable lymphadenopathy in the cervical, axillary or supraclavicular LUNGS: clear to auscultation and percussion with normal breathing effort HEART: regular rate & rhythm and no murmurs and no lower extremity edema Musculoskeletal: no cyanosis of digits and no clubbing  PSYCH: alert & oriented x 3, fluent speech NEURO: no focal motor/sensory deficits  LABORATORY DATA:  I have reviewed the data as listed Recent Results (from the past 2160 hour(s))  C-reactive protein     Status: Abnormal   Collection Time: 05/20/19 11:30 AM  Result Value Ref Range   CRP 3.4 (H) <1.0 mg/dL    Comment: Performed at [facility], [place] [address] [place], [place] [address]  Sedimentation rate     Status: Abnormal   Collection Time: 05/20/19 11:30 AM  Result Value Ref Range   Sed  Rate 25 (H) 0 - 22 mm/hr    Comment: Performed at [facility], [place] [address], [place], [place] [address]  Ferritin     Status: None   Collection Time: 05/20/19 11:30 AM  Result Value Ref Range   Ferritin 133 11 - 307 ng/mL    Comment: Performed at [facility], [place] [address] [place], [place] [address]  Iron and TIBC     Status: Abnormal   Collection Time: 05/20/19 11:30 AM  Result Value Ref Range   Iron 47 41 - 142 ug/dL   TIBC 211 (L) 236 - 444 ug/dL   Saturation Ratios 22 21 - 57 %   UIBC 164 120 - 384 ug/dL    Comment: Performed at [facility], [place] [address], [place], [place] [address]  CMP ([place] only)     Status: Abnormal   Collection Time: 05/20/19 11:30 AM  Result Value Ref Range   Sodium 141 135 - 145 mmol/L   Potassium 3.7 3.5 - 5.1 mmol/L   Chloride 109 98 - 111 mmol/L   CO2 23 22 - 32 mmol/L   Glucose, Bld 97 70 - 99 mg/dL   BUN 7 6 - 20 mg/dL   Creatinine 0.90 0.44 - 1.00 mg/dL   Calcium 8.6 (L) 8.9 - 10.3 mg/dL   Total Protein 7.3 6.5 - 8.1 g/dL   Albumin 3.7 3.5 - 5.0 g/dL   AST 16 15 - 41 U/L   ALT 20 0 - 44 U/L   Alkaline Phosphatase 90 38 - 126 U/L   Total Bilirubin 0.3 0.3 - 1.2 mg/dL   GFR, Est Non Af Am >60 >60 mL/min   GFR, Est AFR Am >60 >60 mL/min   Anion gap 9 5 - 15    Comment: Performed at [facility], [place] [address] [place], [place] [address]  Save Smear [facility])     Status: None   Collection Time: 05/20/19 11:30 AM  Result Value Ref Range  Smear Review SMEAR STAINED AND AVAILABLE FOR REVIEW     Comment: Performed at [facility] [place] [facility], [place] [address], [place], [place] [address]  CBC with Differential ([place] Only)     Status: Abnormal   Collection Time: 05/20/19 11:30 AM  Result Value Ref Range   WBC Count 11.7 (H) 4.0 - 10.5 K/uL   RBC 4.66 3.87 - 5.11 MIL/uL   Hemoglobin 12.7 12.0 - 15.0 g/dL   HCT 39.6 36.0  - 46.0 %   MCV 85.0 80.0 - 100.0 fL   MCH 27.3 26.0 - 34.0 pg   MCHC 32.1 30.0 - 36.0 g/dL   RDW 13.5 11.5 - 15.5 %   Platelet Count 327 150 - 400 K/uL   nRBC 0.0 0.0 - 0.2 %   Neutrophils Relative % 76 %   Neutro Abs 8.9 (H) 1.7 - 7.7 K/uL   Lymphocytes Relative 18 %   Lymphs Abs 2.1 0.7 - 4.0 K/uL   Monocytes Relative 4 %   Monocytes Absolute 0.4 0.1 - 1.0 K/uL   Eosinophils Relative 2 %   Eosinophils Absolute 0.2 0.0 - 0.5 K/uL   Basophils Relative 0 %   Basophils Absolute 0.0 0.0 - 0.1 K/uL   Immature Granulocytes 0 %   Abs Immature Granulocytes 0.04 0.00 - 0.07 K/uL    Comment: Performed at [facility], [place] [address], [place], [place] [address]  Lactate dehydrogenase (LDH)     Status: Abnormal   Collection Time: 05/20/19 11:31 AM  Result Value Ref Range   LDH 205 (H) 98 - 192 U/L    Comment: Performed at [facility], [place] [address] [place], [place] [address]     RADIOGRAPHIC STUDIES: None to review.  ASSESSMENT & PLAN Natalie Buchanan 44 y.o. Buchanan with medical history significant for leukocytosis and iron deficiency anemia presents for a follow up visit.  Reviewed the labs and discussion with the patient her findings are most consistent with leukocytosis secondary to inflammation and an iron deficiency anemia.  Patient's source of iron deficiency is thought to be heavy menstrual cycles which has subsequently been under control for the last 2 years.  The patient received IV iron therapy which appears to have resolved her iron deficiency.  As for her leukocytosis the patient has a low grade neutrophilic predominant leukocytosis which has been present for at least 1 year.  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  As such I think would be reasonable to continue to monitor and have the patient follow-up in 6 months time to assure the  leukocytosis does not worsen and that the patient retains iron.   #Leukocytosis, Neutrophilic Predominance --low grade, present since 05/2018. Likely 2/2 to inflammation from her bursitis with interval increases due to steroid pulses. --will review peripheral blood film today to determine if the neutrophils are normal in morphology.  --will check ESR and CRP to confirm inflammation --continue to monitor, RTC in 6 months time.   #Iron Deficiency Anemia, resolved --likely 2/2 to prior heavy menstrual cycles, subsequently improved with IUD placement 2 years ago --patient had IV iron repletion in April/May of last year with improvement in her total body iron stores --no indication for repeat IV iron or PO iron at this time (prior intolerance to PO iron). --continue to monitor, RTC in 6 months time.   No orders of  the defined types were placed in this encounter.   All questions were answered. The patient knows to call the clinic with any problems, questions or concerns.  A total of more than 40 minutes were spent on this encounter and over half of that time was spent on counseling and coordination of care as outlined above.   Ledell Peoples, MD Department of Hematology/Oncology [place] at [facility] Phone: [phone] Pager: [phone] Email: [email or web address]  05/20/2019 6:06 PM

## 2019-05-20 ENCOUNTER — Inpatient Hospital Stay: Payer: Medicare Other | Attending: Hematology and Oncology | Admitting: Hematology and Oncology

## 2019-05-20 ENCOUNTER — Other Ambulatory Visit: Payer: Self-pay

## 2019-05-20 ENCOUNTER — Inpatient Hospital Stay: Payer: Medicare Other

## 2019-05-20 VITALS — BP 113/61 | HR 103 | Temp 97.3°F | Resp 17 | Ht 65.0 in | Wt 288.7 lb

## 2019-05-20 DIAGNOSIS — Z79899 Other long term (current) drug therapy: Secondary | ICD-10-CM | POA: Diagnosis not present

## 2019-05-20 DIAGNOSIS — D500 Iron deficiency anemia secondary to blood loss (chronic): Secondary | ICD-10-CM

## 2019-05-20 DIAGNOSIS — D509 Iron deficiency anemia, unspecified: Secondary | ICD-10-CM | POA: Insufficient documentation

## 2019-05-20 DIAGNOSIS — D72829 Elevated white blood cell count, unspecified: Secondary | ICD-10-CM | POA: Insufficient documentation

## 2019-05-20 DIAGNOSIS — D508 Other iron deficiency anemias: Secondary | ICD-10-CM

## 2019-05-20 LAB — IRON AND TIBC
Iron: 47 ug/dL (ref 41–142)
Saturation Ratios: 22 % (ref 21–57)
TIBC: 211 ug/dL — ABNORMAL LOW (ref 236–444)
UIBC: 164 ug/dL (ref 120–384)

## 2019-05-20 LAB — CMP (CANCER CENTER ONLY)
ALT: 20 U/L (ref 0–44)
AST: 16 U/L (ref 15–41)
Albumin: 3.7 g/dL (ref 3.5–5.0)
Alkaline Phosphatase: 90 U/L (ref 38–126)
Anion gap: 9 (ref 5–15)
BUN: 7 mg/dL (ref 6–20)
CO2: 23 mmol/L (ref 22–32)
Calcium: 8.6 mg/dL — ABNORMAL LOW (ref 8.9–10.3)
Chloride: 109 mmol/L (ref 98–111)
Creatinine: 0.9 mg/dL (ref 0.44–1.00)
GFR, Est AFR Am: >60 mL/min (ref >60)
GFR, Estimated: >60 mL/min (ref >60)
Glucose, Bld: 97 mg/dL (ref 70–99)
Potassium: 3.7 mmol/L (ref 3.5–5.1)
Sodium: 141 mmol/L (ref 135–145)
Total Bilirubin: 0.3 mg/dL (ref 0.3–1.2)
Total Protein: 7.3 g/dL (ref 6.5–8.1)

## 2019-05-20 LAB — CBC WITH DIFFERENTIAL (CANCER CENTER ONLY)
Abs Immature Granulocytes: 0.04 10*3/uL (ref 0.00–0.07)
Basophils Absolute: 0 10*3/uL (ref 0.0–0.1)
Basophils Relative: 0 %
Eosinophils Absolute: 0.2 10*3/uL (ref 0.0–0.5)
Eosinophils Relative: 2 %
HCT: 39.6 % (ref 36.0–46.0)
Hemoglobin: 12.7 g/dL (ref 12.0–15.0)
Immature Granulocytes: 0 %
Lymphocytes Relative: 18 %
Lymphs Abs: 2.1 10*3/uL (ref 0.7–4.0)
MCH: 27.3 pg (ref 26.0–34.0)
MCHC: 32.1 g/dL (ref 30.0–36.0)
MCV: 85 fL (ref 80.0–100.0)
Monocytes Absolute: 0.4 10*3/uL (ref 0.1–1.0)
Monocytes Relative: 4 %
Neutro Abs: 8.9 10*3/uL — ABNORMAL HIGH (ref 1.7–7.7)
Neutrophils Relative %: 76 %
Platelet Count: 327 10*3/uL (ref 150–400)
RBC: 4.66 MIL/uL (ref 3.87–5.11)
RDW: 13.5 % (ref 11.5–15.5)
WBC Count: 11.7 10*3/uL — ABNORMAL HIGH (ref 4.0–10.5)
nRBC: 0 % (ref 0.0–0.2)

## 2019-05-20 LAB — SEDIMENTATION RATE: Sed Rate: 25 mm/hr — ABNORMAL HIGH (ref 0–22)

## 2019-05-20 LAB — SAVE SMEAR(SSMR), FOR PROVIDER SLIDE REVIEW

## 2019-05-20 LAB — FERRITIN: Ferritin: 133 ng/mL (ref 11–307)

## 2019-05-20 LAB — LACTATE DEHYDROGENASE: LDH: 205 U/L — ABNORMAL HIGH (ref 98–192)

## 2019-05-20 LAB — C-REACTIVE PROTEIN: CRP: 3.4 mg/dL — ABNORMAL HIGH (ref <1.0)

## 2019-05-21 ENCOUNTER — Telehealth: Payer: Self-pay | Admitting: Hematology and Oncology

## 2019-05-21 NOTE — Telephone Encounter (Signed)
Scheduled per los. Called and left msg. Mailed printout  °

## 2019-06-26 ENCOUNTER — Telehealth: Payer: Self-pay | Admitting: *Deleted

## 2019-06-26 NOTE — Telephone Encounter (Signed)
Records faxed to [facility] - release [id number]

## 2019-09-20 IMAGING — MR MRI CERVICAL SPINE WITHOUT CONTRAST
6 series · 43 of 48 positions shown · non-contrast
Comparison: None.

CLINICAL DATA: 43 y/o  F; 1 week of right arm pain.

EXAM:
MRI CERVICAL SPINE WITHOUT CONTRAST
TECHNIQUE: Multiplanar, multisequence MR imaging of the cervical spine was
performed. No intravenous contrast was administered.

[Series 5: T2 · sagittal · 3.0mm · 0.69mm/px · 5 of 15 slices shown (1 of 3)]
[im 1/15]
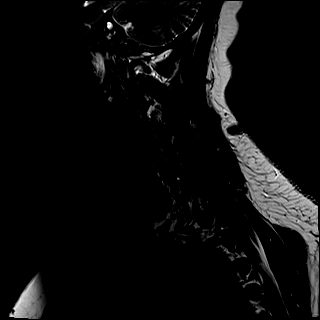
[im 4/15]
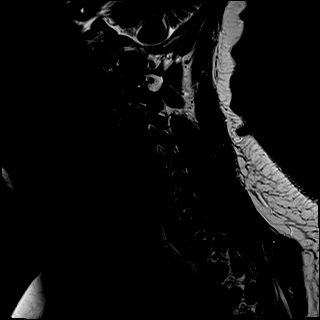
[im 8/15]
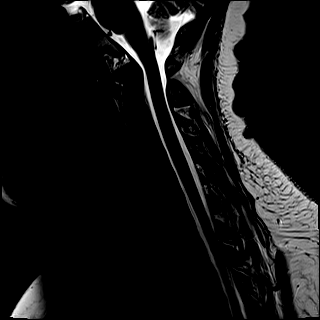
[im 11/15]
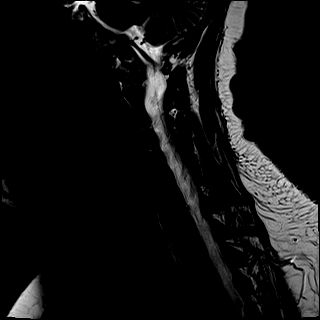
[im 15/15]
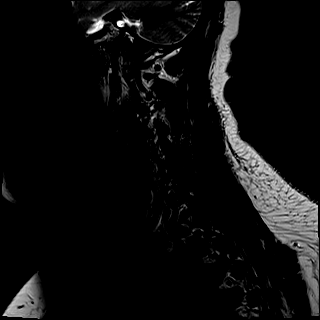

[Series 6: T1 · sagittal · 3.0mm · 0.69mm/px · 5 of 15 slices shown]
[im 1/15]
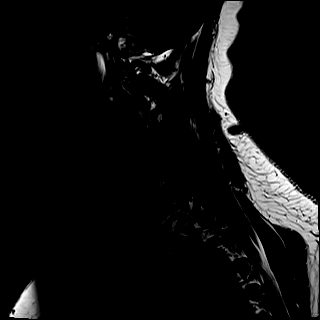
[im 4/15]
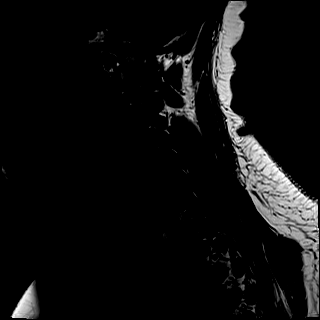
[im 8/15]
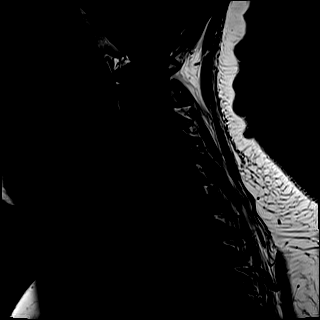
[im 11/15]
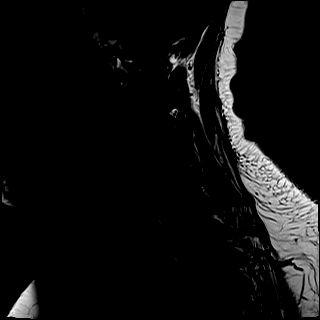
[im 15/15]
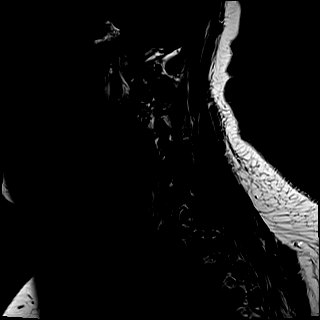

[Series 7: STIR · sagittal · 3.0mm · 0.86mm/px · 5 of 15 slices shown]
[im 1/15]
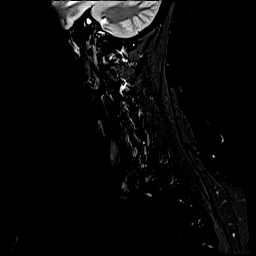
[im 4/15]
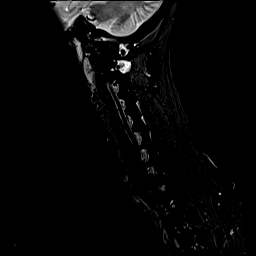
[im 8/15]
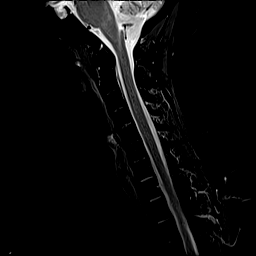
[im 11/15]
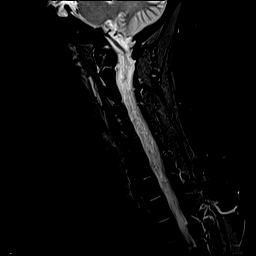
[im 15/15]
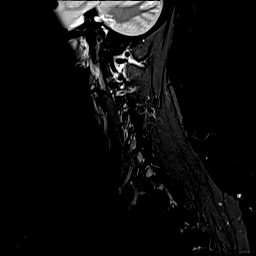

[Series 8: T2 · axial · 3.0mm · 0.66mm/px · z∈[-45,+46]mm · 11 of 31 slices shown (2 of 3)]
[im 1/31]
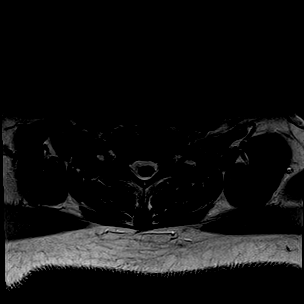
[im 4/31]
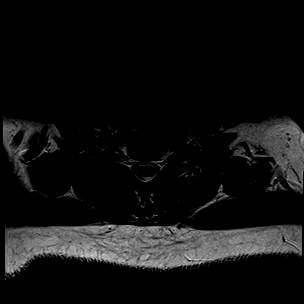
[im 7/31]
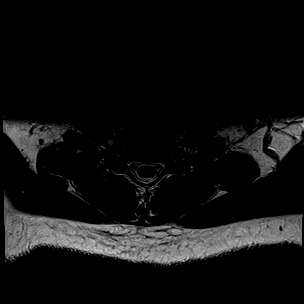
[im 10/31]
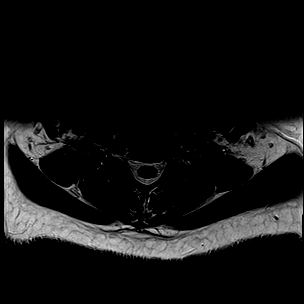
[im 13/31]
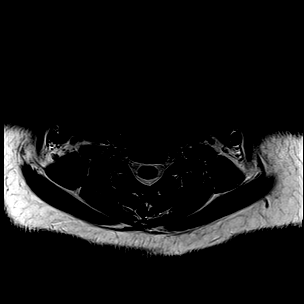
[im 16/31]
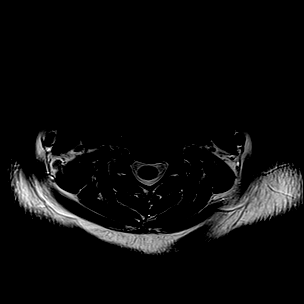
[im 19/31]
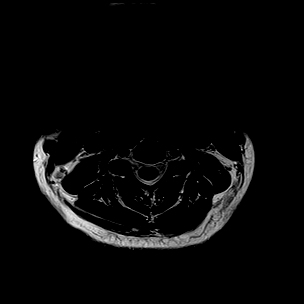
[im 22/31]
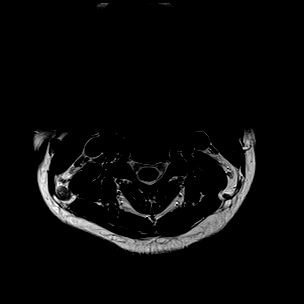
[im 25/31]
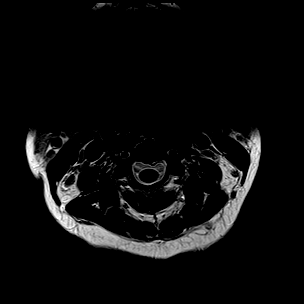
[im 28/31]
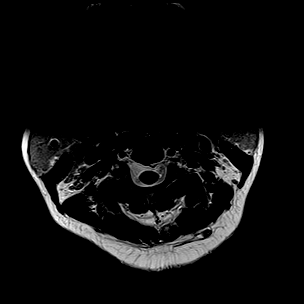
[im 31/31]
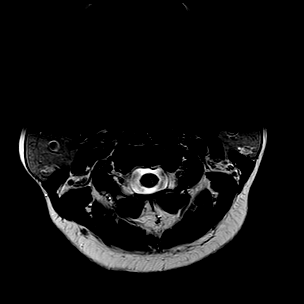

[Series 9: GRE · axial · 3.0mm · 0.39mm/px · z∈[-47,+17]mm · 6 of 32 slices shown]
[im 1/32]
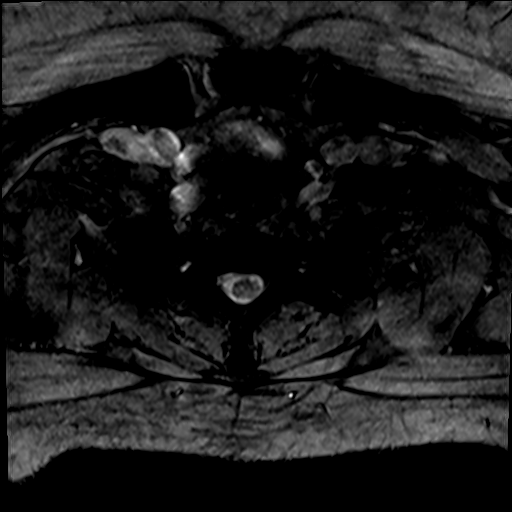
[im 7/32]
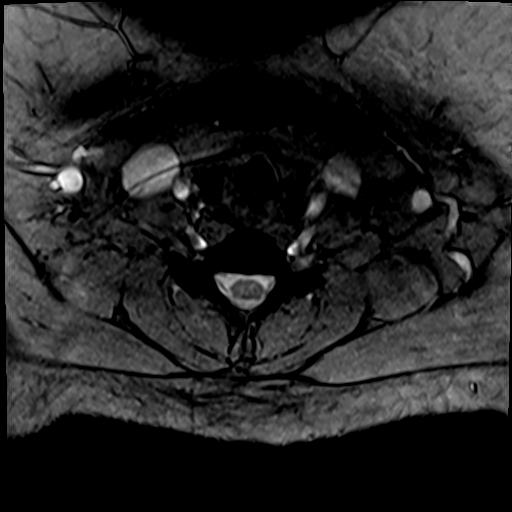
[im 10/32]
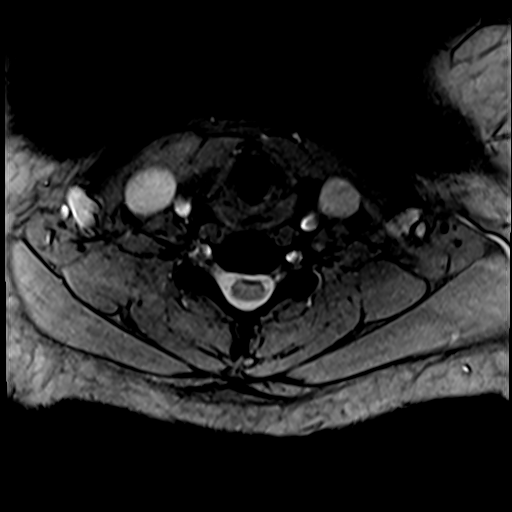
[im 13/32]
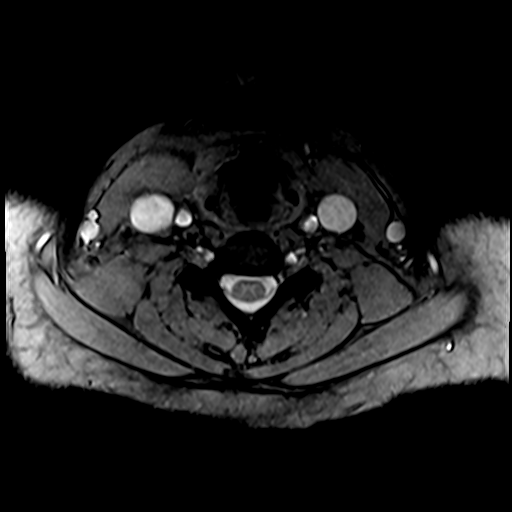
[im 19/32]
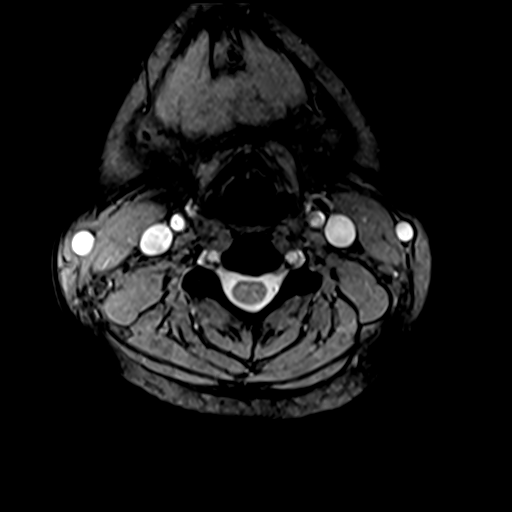
[im 22/32]
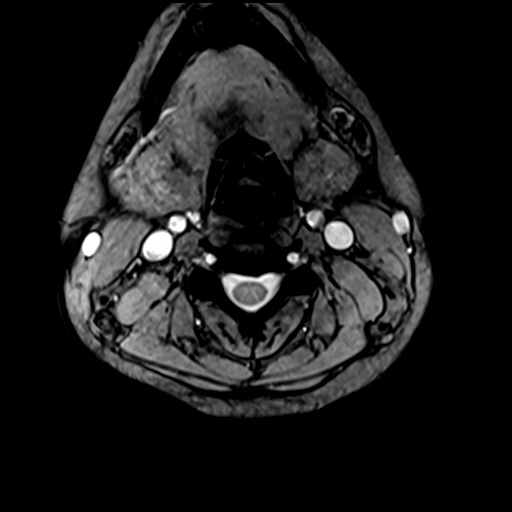

[Series 10: T2 · axial · 3.0mm · 0.66mm/px · z∈[-39,+59]mm · 11 of 33 slices shown (3 of 3)]
[im 1/33]
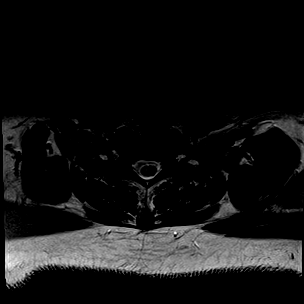
[im 4/33]
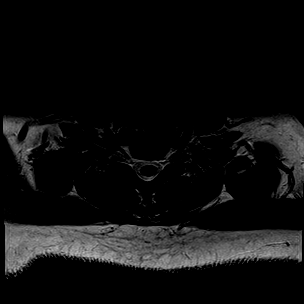
[im 7/33]
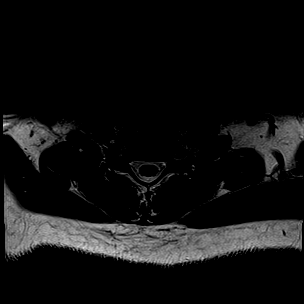
[im 10/33]
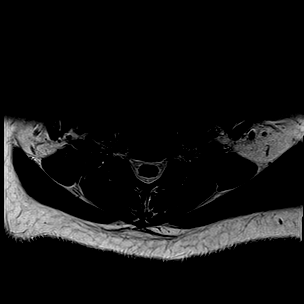
[im 13/33]
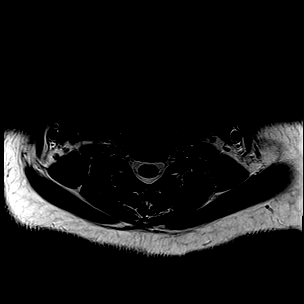
[im 17/33]
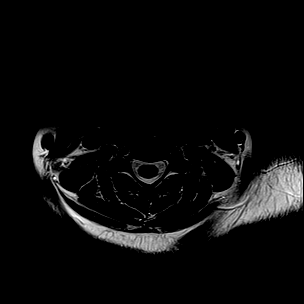
[im 20/33]
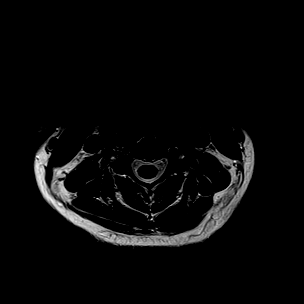
[im 23/33]
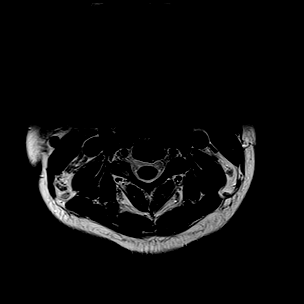
[im 26/33]
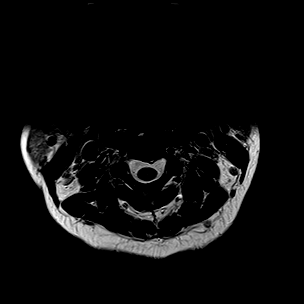
[im 29/33]
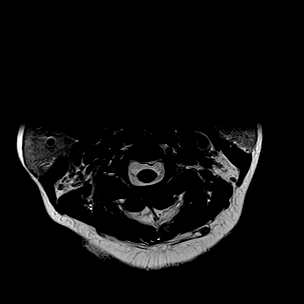
[im 33/33]
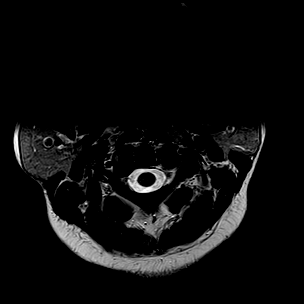

[43 of 48 positions shown; findings below may reference images not displayed]

FINDINGS: Alignment: Straightening of cervical lordosis without listhesis.

Vertebrae: No fracture, evidence of discitis, or bone lesion.

Cord: Normal signal and morphology.

Posterior Fossa, vertebral arteries, paraspinal tissues: Negative.

Disc levels:

No significant disc displacement, foraminal stenosis, or canal
stenosis.
IMPRESSION: No finding as explanation for pain. Unremarkable cervical spine MRI.

## 2019-09-22 ENCOUNTER — Encounter: Payer: Self-pay | Admitting: Dietician

## 2019-09-22 ENCOUNTER — Encounter: Payer: Medicare Other | Attending: Internal Medicine | Admitting: Dietician

## 2019-09-22 DIAGNOSIS — Z6841 Body Mass Index (BMI) 40.0 and over, adult: Secondary | ICD-10-CM | POA: Insufficient documentation

## 2019-09-22 DIAGNOSIS — R635 Abnormal weight gain: Secondary | ICD-10-CM | POA: Diagnosis not present

## 2019-09-22 DIAGNOSIS — Z713 Dietary counseling and surveillance: Secondary | ICD-10-CM | POA: Diagnosis present

## 2019-09-22 NOTE — Patient Instructions (Signed)
Remember your goals:   Work on adjusting sleep schedule to gradually go to bed earlier at night and wake up earlier in the morning   Have healthful foods available in the house to snack on and use to prepare meals

## 2019-09-22 NOTE — Progress Notes (Signed)
Medical Nutrition Therapy   Primary concerns today: general healthy eating and weight management   Referral diagnosis: R63.5- abnormal weight gain Preferred learning style: no preference indicated Learning readiness: ready   NUTRITION ASSESSMENT   Anthropometrics  Weight: 287.5 lbs BMI: 47.8    Lifestyle & Dietary Hx Patient states she is currently in the middle of moving so she is very busy and her schedule is off. States her main goal is to readjust her schedule so that she is going to bed earlier and therefore able to get up earlier. Currently goes to bed around 11-12:00pm and wakes up just before lunchtime. Typical meal pattern is 2 meals per day (misses breakfast, first meal is lunch) plus lots of snacks. States she struggles with cravings. Meals may be fish with green beans, [employer] chili with broccoli, shrimp scampi with salad, chicken with cabbage, or generally a meat with vegetable. May snack on cookies, chocolate fudge donut, cheesecake, or other sweet. States that, once she gets on a better sleep schedule, she would like to go to the gym in the mornings and plan out meals.   Estimated daily fluid intake: 64+ oz Supplements: Miralax  Sleep: abnormal schedule, sleeps late Stress / self-care: stressed while in the middle of moving  Current average weekly physical activity: ADLs  24-Hr Dietary Recall First Meal: - Snack: - Second Meal: leftovers  Snack: cookie Third Meal: chicken + cabbage Snack: dessert Beverages: water  Estimated Energy Needs Calories: 1600-1800 Carbohydrate: 180-200g Protein: 100-113g Fat: 53-60g   NUTRITION DIAGNOSIS  Food and nutrition-related knowledge deficit (NB-1.1) related to lack of prior nutrition education as evidenced by questions raised regarding healthful eating and referral for nutrition education.    NUTRITION INTERVENTION  Nutrition education (E-1) on the following topics:  . General healthful balanced diet- MyPlate, variety of  foods, make half your plate vegetables, plenty of water/fluids, avoid added sugar, etc . Weight management- prioritize fiber and protein, try to eat a balanced breakfast, small frequent meals, drink lots of water, eat when hungry/stop when full   Handouts Provided Include   MyPlate Portions & Meal Ideas   Breakfast Ideas  Balanced Snacks   Learning Style & Readiness for Change Teaching method utilized: Visual & Auditory  Demonstrated degree of understanding via: Teach Back  Barriers to learning/adherence to lifestyle change: Busy Lifestyle  Goals Established by Pt . Work on adjusting sleep schedule to have earlier bedtime and earlier wake time  . Have healthful foods available in the house to snack on and use to prepare meals    MONITORING & EVALUATION Dietary intake, weekly physical activity, and goals PRN.  Next Steps  Patient is to contact [facility] to schedule follow up appointment as needed. Patient may contact via phone/email in the meantime with questions/concerns.

## 2019-11-18 ENCOUNTER — Inpatient Hospital Stay: Payer: Medicare Other

## 2019-11-18 ENCOUNTER — Inpatient Hospital Stay: Payer: Medicare Other | Attending: Hematology and Oncology | Admitting: Hematology and Oncology

## 2019-11-18 ENCOUNTER — Other Ambulatory Visit: Payer: Self-pay | Admitting: Hematology and Oncology

## 2019-11-18 DIAGNOSIS — D539 Nutritional anemia, unspecified: Secondary | ICD-10-CM

## 2019-11-19 ENCOUNTER — Other Ambulatory Visit: Payer: Self-pay | Admitting: Physician Assistant

## 2019-11-19 DIAGNOSIS — Z1231 Encounter for screening mammogram for malignant neoplasm of breast: Secondary | ICD-10-CM

## 2021-08-30 ENCOUNTER — Encounter: Payer: Self-pay | Admitting: Internal Medicine

## 2021-09-11 ENCOUNTER — Ambulatory Visit: Payer: Medicare Other | Admitting: Podiatry

## 2021-10-12 ENCOUNTER — Ambulatory Visit (INDEPENDENT_AMBULATORY_CARE_PROVIDER_SITE_OTHER): Payer: Medicare Other | Admitting: Podiatry

## 2021-10-12 DIAGNOSIS — L608 Other nail disorders: Secondary | ICD-10-CM | POA: Diagnosis not present

## 2021-10-12 DIAGNOSIS — B351 Tinea unguium: Secondary | ICD-10-CM | POA: Diagnosis not present

## 2021-10-12 MED ORDER — TERBINAFINE HCL 250 MG PO TABS: 250.0000 mg | ORAL_TABLET | Freq: Every day | ORAL | 0 refills | Status: AC

## 2021-10-16 NOTE — Progress Notes (Signed)
  Subjective:  Patient ID: Natalie Buchanan, female    DOB: September 07, 1975,  MRN: [id number]  Chief Complaint  Patient presents with   Tinea Pedis    Patient is is here for Tinea pedis of both feet.    46 y.o. female presents with the above complaint. History confirmed with patient.  She notes worsening discoloration of toenails on both feet  Objective:  Physical Exam: warm, good capillary refill, no trophic changes or ulcerative lesions, normal DP and PT pulses, normal sensory exam, and onychomycosis.  There are multiple longitudinal and Exogen noted as well in multiple toenails and fingernails      Assessment:   1. Onychomycosis      Plan:  Patient was evaluated and treated and all questions answered.  We discussed the presence of the melanonychia and this is likely benign pigmentation, could be related to her Plaquenil use as she thinks they started her on the same time and this can be a known side effect of hydroxychloroquine use.  I do think she has some onychomycosis there is yellow discoloration with lifting of the nail on the right hallux.  We discussed the etiology and treatment options of this including oral topical and laser therapy.  I recommended oral therapy with Lamisil.  90-day course sent to pharmacy.  Photographs were taken.  I will see her back in 3 months for follow-up.  She has no medication interactions with Lamisil and no history of liver disease  Return in about 3 months (around 01/12/2022) for follow up after nail fungus treatment.

## 2022-01-18 ENCOUNTER — Ambulatory Visit (INDEPENDENT_AMBULATORY_CARE_PROVIDER_SITE_OTHER): Payer: Self-pay | Admitting: Podiatry

## 2022-01-18 DIAGNOSIS — Z91199 Patient's noncompliance with other medical treatment and regimen due to unspecified reason: Secondary | ICD-10-CM

## 2022-01-21 NOTE — Progress Notes (Signed)
Patient was no-show for appointment today
